# Patient Record
Sex: Female | Born: 1950 | Race: White | Hispanic: No | Marital: Married | Smoking: Former smoker
Health system: Southern US, Community
[De-identification: ages and names within clinical notes are randomized; demographics above are authoritative.]

## PROBLEM LIST (undated history)

## (undated) DIAGNOSIS — K219 Gastro-esophageal reflux disease without esophagitis: Secondary | ICD-10-CM

## (undated) DIAGNOSIS — M4800 Spinal stenosis, site unspecified: Secondary | ICD-10-CM

## (undated) DIAGNOSIS — I350 Nonrheumatic aortic (valve) stenosis: Secondary | ICD-10-CM

## (undated) DIAGNOSIS — F0390 Unspecified dementia without behavioral disturbance: Secondary | ICD-10-CM

## (undated) DIAGNOSIS — G709 Myoneural disorder, unspecified: Secondary | ICD-10-CM

## (undated) DIAGNOSIS — Z9289 Personal history of other medical treatment: Secondary | ICD-10-CM

## (undated) DIAGNOSIS — M5136 Other intervertebral disc degeneration, lumbar region: Secondary | ICD-10-CM

## (undated) DIAGNOSIS — R011 Cardiac murmur, unspecified: Secondary | ICD-10-CM

## (undated) DIAGNOSIS — Z01810 Encounter for preprocedural cardiovascular examination: Secondary | ICD-10-CM

## (undated) DIAGNOSIS — R413 Other amnesia: Secondary | ICD-10-CM

## (undated) DIAGNOSIS — R0602 Shortness of breath: Secondary | ICD-10-CM

## (undated) DIAGNOSIS — E785 Hyperlipidemia, unspecified: Secondary | ICD-10-CM

## (undated) DIAGNOSIS — M25473 Effusion, unspecified ankle: Secondary | ICD-10-CM

## (undated) DIAGNOSIS — K5900 Constipation, unspecified: Secondary | ICD-10-CM

## (undated) DIAGNOSIS — J45909 Unspecified asthma, uncomplicated: Secondary | ICD-10-CM

## (undated) HISTORY — DX: Other amnesia: R41.3

## (undated) HISTORY — DX: Other intervertebral disc degeneration, lumbar region: M51.36

## (undated) HISTORY — DX: Spinal stenosis, site unspecified: M48.00

## (undated) HISTORY — DX: Nonrheumatic aortic (valve) stenosis: I35.0

## (undated) HISTORY — DX: Unspecified asthma, uncomplicated: J45.909

## (undated) HISTORY — DX: Cardiac murmur, unspecified: R01.1

## (undated) HISTORY — DX: Hyperlipidemia, unspecified: E78.5

---

## 2008-06-15 ENCOUNTER — Other Ambulatory Visit: Admission: RE | Admit: 2008-06-15 | Discharge: 2008-06-15 | Payer: Self-pay | Admitting: Family Medicine

## 2012-04-06 ENCOUNTER — Other Ambulatory Visit: Payer: Self-pay | Admitting: *Deleted

## 2012-04-06 ENCOUNTER — Other Ambulatory Visit: Payer: Self-pay | Admitting: Family Medicine

## 2012-04-06 DIAGNOSIS — R413 Other amnesia: Secondary | ICD-10-CM

## 2012-04-09 ENCOUNTER — Ambulatory Visit
Admission: RE | Admit: 2012-04-09 | Discharge: 2012-04-09 | Disposition: A | Discharge status: Home / Self Care | Payer: Federal, State, Local not specified - PPO | Source: Ambulatory Visit | Attending: Family Medicine | Admitting: Family Medicine

## 2012-04-09 DIAGNOSIS — R413 Other amnesia: Secondary | ICD-10-CM

## 2012-05-18 DIAGNOSIS — R41840 Attention and concentration deficit: Secondary | ICD-10-CM

## 2012-05-25 DIAGNOSIS — R41840 Attention and concentration deficit: Secondary | ICD-10-CM

## 2012-06-23 ENCOUNTER — Other Ambulatory Visit: Payer: Self-pay | Admitting: Neurosurgery

## 2012-06-23 DIAGNOSIS — M542 Cervicalgia: Secondary | ICD-10-CM

## 2012-06-23 DIAGNOSIS — M5030 Other cervical disc degeneration, unspecified cervical region: Secondary | ICD-10-CM

## 2012-06-29 ENCOUNTER — Ambulatory Visit
Admission: RE | Admit: 2012-06-29 | Discharge: 2012-06-29 | Disposition: A | Discharge status: Home / Self Care | Payer: Federal, State, Local not specified - PPO | Source: Ambulatory Visit | Attending: Neurosurgery | Admitting: Neurosurgery

## 2012-06-29 DIAGNOSIS — M5030 Other cervical disc degeneration, unspecified cervical region: Secondary | ICD-10-CM

## 2012-06-29 DIAGNOSIS — M542 Cervicalgia: Secondary | ICD-10-CM

## 2012-07-06 ENCOUNTER — Encounter: Payer: Self-pay | Admitting: Internal Medicine

## 2012-07-06 ENCOUNTER — Ambulatory Visit (HOSPITAL_COMMUNITY)
Admission: RE | Admit: 2012-07-06 | Discharge: 2012-07-06 | Disposition: A | Discharge status: Home / Self Care | Payer: Federal, State, Local not specified - PPO | Source: Ambulatory Visit | Attending: Internal Medicine | Admitting: Internal Medicine

## 2012-07-06 ENCOUNTER — Ambulatory Visit (INDEPENDENT_AMBULATORY_CARE_PROVIDER_SITE_OTHER): Payer: Federal, State, Local not specified - PPO | Admitting: Internal Medicine

## 2012-07-06 VITALS — BP 124/74 | HR 72 | Temp 97.6°F | Ht <= 58 in | Wt 216.6 lb

## 2012-07-06 DIAGNOSIS — R0602 Shortness of breath: Secondary | ICD-10-CM | POA: Insufficient documentation

## 2012-07-06 DIAGNOSIS — R0600 Dyspnea, unspecified: Secondary | ICD-10-CM

## 2012-07-06 DIAGNOSIS — R0989 Other specified symptoms and signs involving the circulatory and respiratory systems: Secondary | ICD-10-CM | POA: Insufficient documentation

## 2012-07-06 DIAGNOSIS — R0609 Other forms of dyspnea: Secondary | ICD-10-CM

## 2012-07-06 LAB — PULMONARY FUNCTION TEST

## 2012-07-06 MED ORDER — ALBUTEROL SULFATE (5 MG/ML) 0.5% IN NEBU
2.5000 mg | INHALATION_SOLUTION | Freq: Once | RESPIRATORY_TRACT | Status: AC
  Administered 2012-07-06: 2.5 mg via RESPIRATORY_TRACT

## 2012-07-06 NOTE — Assessment & Plan Note (Signed)
-   07/06/2012  Walked RA x 3 laps @ 185 ft each stopped due to  End of study, no desat - Fluoroscopy diaphragm  07/06/2012 > sym and nl fx bilaterally - PFTs ordered  She does not have significant copd clinically but would like to get a baseline set of pft's and note her diaphragms work fine but the phrenic nerves are at risk - given her body habitus she'll need both to lie flat flat comfortably and may benefit from post op bipap transiently but I see no pulmonary contraindicatoin to surgery.    Obesity also risks atx/ dvt/pe so strongly admonished min narcotics and maximal mobilization post op  In addition, max gerd rx perioperatively to reduce upper airway irritation from suspected gerd.  See instructions for specific recommendations which were reviewed directly with the patient who was given a copy with highlighter outlining the key components.

## 2012-07-06 NOTE — Progress Notes (Signed)
  Subjective:    Patient ID: Anna Lawson, female    DOB: 1951-01-27  MRN: [id number]  HPI  [facility]   64 yowf quit smoking around 2003 with mild doe seemed some better and no change on spriva referred 07/06/2012  by Dr Wynetta Emery for preop eval for possible cervical fusion.  07/06/2012 1st pulmonary eval cc worse breathing x 6 months not able to do go a full flight s stopping assoc with tingling hands and reports concern her diaphragm nerves may be at risk.  She is uncomfortable also lying flat with some sensation of chest and throat but No obvious daytime variabilty or assoc chronic cough or cp or chest tightness, subjective wheeze overt sinus or hb symptoms. No unusual exp hx or h/o childhood pna/ asthma or premature birth to her knowledge.  Also denies any obvious fluctuation of symptoms with weather or environmental changes or other aggravating or alleviating factors except as outlined above         Review of Systems  Constitutional: Negative for fever, chills and unexpected weight change.  HENT: Positive for dental problem. Negative for ear pain, nosebleeds, congestion, sore throat, rhinorrhea, sneezing, trouble swallowing, voice change, postnasal drip and sinus pressure.   Eyes: Negative for visual disturbance.  Respiratory: Positive for shortness of breath. Negative for cough and choking.   Cardiovascular: Positive for chest pain and leg swelling.  Gastrointestinal: Negative for vomiting, abdominal pain and diarrhea.  Genitourinary: Negative for difficulty urinating.  Musculoskeletal: Positive for arthralgias.  Skin: Negative for rash.  Neurological: Negative for tremors, syncope and headaches.  Hematological: Does not bruise/bleed easily.       Objective:   Physical Exam Anxious obese wf nad Wt Readings from Last 3 Encounters:  07/06/12 216 lb 9.6 oz (98.249 kg)   HEENT: nl dentition, turbinates, and orophanx. Nl external ear canals without cough reflex   NECK :  without  JVD/Nodes/TM/ nl carotid upstrokes bilaterally   LUNGS: no acc muscle use, clear to A and P bilaterally without cough on insp or exp maneuvers   CV:  RRR  no s3 or murmur or increase in P2, no edema   ABD:  Obese soft and nontender with nl excursion in the supine position. No bruits or organomegaly, bowel sounds nl  MS:  warm without deformities, calf tenderness, cyanosis or clubbing  SKIN: warm and dry without lesions    NEURO:  alert, approp, no deficits    Fluoroscopy/ sniff test 07/06/2012 : Nl diaphragm fxn       Assessment & Plan:

## 2012-07-06 NOTE — Progress Notes (Signed)
Quick Note:  Spoke with pt and notified of results per Dr. Wert. Pt verbalized understanding and denied any questions.  ______ 

## 2012-07-06 NOTE — Patient Instructions (Addendum)
Please see patient coordinator before you leave today  to schedule fluorosocopy of diaphragms  asap along with pft's to measure Max Insp Force   GERD (REFLUX)  is an extremely common cause of respiratory symptoms, many times with no significant heartburn at all.    It can be treated with medication, but also with lifestyle changes including avoidance of late meals, excessive alcohol, smoking cessation, and avoid fatty foods, chocolate, peppermint, colas, red wine, and acidic juices such as orange juice.  NO MINT OR MENTHOL PRODUCTS SO NO COUGH DROPS  USE SUGARLESS CANDY INSTEAD (jolley ranchers or Stover's)  NO OIL BASED VITAMINS - use powdered substitutes - No fish oil    Prilosec 20 mg Take 30- 60 min before your first and last meals of the day until surgery    The most important aspect of your care is mobilization post op.

## 2012-07-08 ENCOUNTER — Telehealth: Payer: Self-pay | Admitting: Internal Medicine

## 2012-07-08 NOTE — Telephone Encounter (Signed)
Per Dr. Sherene Sires- PFT shows minimal airflow obstruction. Ok to proceed with surgery.  ATC the pt with results, NA and so LMTCB.

## 2012-07-08 NOTE — Telephone Encounter (Signed)
Results and note were both faxed to Dr. Lonie Peak office at [phone]

## 2012-07-08 NOTE — Telephone Encounter (Signed)
Patient returned call.  Advised of PFT results as stated by MW below.  Pt verbalized her understanding and requests these results and phone note be faxed to Dr Lonie Peak office.  As Verlon Au has the interpreted results, will route message to her inbasket per her request.

## 2012-07-15 ENCOUNTER — Other Ambulatory Visit: Payer: Self-pay | Admitting: Neurosurgery

## 2012-07-16 ENCOUNTER — Encounter (HOSPITAL_COMMUNITY): Payer: Self-pay | Admitting: Vascular Surgery

## 2012-08-03 ENCOUNTER — Encounter (HOSPITAL_COMMUNITY): Payer: Self-pay | Admitting: Pharmacy Technician

## 2012-08-06 ENCOUNTER — Encounter (HOSPITAL_COMMUNITY): Payer: Self-pay | Admitting: Vascular Surgery

## 2012-08-06 NOTE — Consult Note (Addendum)
Anesthesia chart review: Patient is a 62 year old female posted for 4 level posterior cervical fusion/foraminotomy by Dr. Wynetta Emery on 08/12/12.  History includes morbid obesity, former smoker, spinal stenosis, hyperlipidemia, heart murmur with echo from 08/2011 showing bicuspid AV with mild to moderate AR and moderate AS.  PCP is Dr. Laurann Montana.  She has also been seen Dr. Vickey Huger at [facility]) recently for possible dementia and sleep disturbance work-up .  Cardiologist is Dr. Armanda Magic [facility]).  She was last seen on 09/05/11 for follow-up of AS and "constant chest tightness" with exertion (when she become SOB) with history of normal coronaries by cath in 2006.  Dr. Mayford Knife recommended a repeat echo (see below) that showed some progression of her AS from mild to moderate with one year follow-up with echo recommended.  Following the echo, she recommended a stress test (see below) that was normal.  Event monitor read on 10/21/11 showed NSR, SB during sleep as low as 46 bpm, ST as high as 136 bpm, occasional PACs and PVCs which were felt benign.    EKG on 09/05/11 showed SB @ 58 bpm, poor r wave progression, negative T waves in septal leads.  Nuclear stress test on 09/27/11 [facility]) showed normal myocardial perfusion, no regional wall motion abnormalities. There is stress EF 83%. Exercise capacity 5.0 METS.  Echo [facility]) from 09/11/11 showed left ventricular ejection fraction estimated at 57.9%, trace mitral valve regurgitation, aortic valve appears to be bicuspid, aortic valve is sclerotic with reduced excursion, mild to moderate aortic valve regurgitation, moderate aortic valve stenosis with AVA (vel) 0.93 cm2 and AVA (vti) 0.97 cm2, mild aortic root dilation, trivial posterior pericardial effusion, trivial tricuspid regurgitation, trace pulmonic valve regurgitation, pulmonary vein Doppler and tissue Doppler suggests normal diastolic function without elevated left  atrial pressure.  She had a cardiac cath on 03/26/05 at [facility] that showed angiographically normal coronary arteries, normal left ventricular function, mild aortic stenosis, mild aortic insufficiency, no mitral regurgitation or mitral stenosis, normal PA pressures and hemodynamics. She was followed by Cardiologist Dr. Sherre Scarlet at that time for AS/AR with bacterial endocarditis prophylaxis recommended.  Sleep study done in 05/2012 ([facility]) showed hypoxemia with possible cardiac arrhythmia ("extremely variable heart rate"), but not sleep apnea.    Dr. Wynetta Emery sent patient to Dr. Sherene Sires for a preoperative pulmonology evaluation. Please refer to his note from 07/06/2012. His assessment included: "- 07/06/2012 Walked RA x 3 laps @ 185 ft each stopped due to End of study, no desat  - Fluoroscopy diaphragm 07/06/2012 > sym and nl fx bilaterally  - PFTs ordered  She does not have significant copd clinically but would like to get a baseline set of pft's and note her diaphragms work fine but the phrenic nerves are at risk - given her body habitus she'll need both to lie flat flat comfortably and may benefit from post op bipap transiently but I see no pulmonary contraindicatoin to surgery.  Obesity also risks atx/ dvt/pe so strongly admonished min narcotics and maximal mobilization post op  In addition, max gerd rx perioperatively to reduce upper airway irritation from suspected gerd.  See instructions for specific recommendations which were reviewed directly with the patient who was given a copy with highlighter outlining the key components." See PFT results with faxed records from [facility] from [facility].  These were reviewed by Dr. Sherene Sires, as "PFT shows minimal airflow obstruction. Ok to proceed with surgery."   CXR on 08/07/12 showed cardiomegaly.  No active lung disease. Slight hyperaeration.  Preoperative labs noted.  I spoke with patient during her PAT visit.  She has no new CV  symptoms.  No syncope.  Exam shows heart RRR, II/VI SEM, lungs clear, no carotid bruits.  Legs large, but no pitting edema. She feels at her baseline from a cardiopulmonary standpoint.  I reviewed above with Anesthesiologist Dr. Jean Rosenthal who agrees that patient should be okay to proceed if no significant change in her status.  Shonna Chock, PA-C 08/07/12 1200

## 2012-08-07 ENCOUNTER — Encounter (HOSPITAL_COMMUNITY)
Admission: RE | Admit: 2012-08-07 | Discharge: 2012-08-07 | Disposition: A | Discharge status: Home / Self Care | Payer: Federal, State, Local not specified - PPO | Source: Ambulatory Visit | Attending: Anesthesiology | Admitting: Anesthesiology

## 2012-08-07 ENCOUNTER — Encounter (HOSPITAL_COMMUNITY): Payer: Self-pay | Admitting: Vascular Surgery

## 2012-08-07 ENCOUNTER — Encounter (HOSPITAL_COMMUNITY)
Admission: RE | Admit: 2012-08-07 | Discharge: 2012-08-07 | Disposition: A | Discharge status: Home / Self Care | Payer: Federal, State, Local not specified - PPO | Source: Ambulatory Visit | Attending: Neurosurgery | Admitting: Neurosurgery

## 2012-08-07 ENCOUNTER — Encounter (HOSPITAL_COMMUNITY): Payer: Self-pay

## 2012-08-07 HISTORY — DX: Shortness of breath: R06.02

## 2012-08-07 HISTORY — DX: Unspecified dementia, unspecified severity, without behavioral disturbance, psychotic disturbance, mood disturbance, and anxiety: F03.90

## 2012-08-07 HISTORY — DX: Personal history of other medical treatment: Z92.89

## 2012-08-07 HISTORY — DX: Effusion, unspecified ankle: M25.473

## 2012-08-07 HISTORY — DX: Constipation, unspecified: K59.00

## 2012-08-07 LAB — CBC
HCT: 44.4 % (ref 36.0–46.0)
Hemoglobin: 14.8 g/dL (ref 12.0–15.0)
RBC: 4.97 MIL/uL (ref 3.87–5.11)

## 2012-08-07 LAB — SURGICAL PCR SCREEN: MRSA, PCR: NEGATIVE

## 2012-08-07 LAB — BASIC METABOLIC PANEL
Chloride: 100 mEq/L (ref 96–112)
GFR calc Af Amer: >90 mL/min (ref >90)
GFR calc non Af Amer: >90 mL/min (ref >90)
Potassium: 4 mEq/L (ref 3.5–5.1)
Sodium: 140 mEq/L (ref 135–145)

## 2012-08-07 NOTE — Pre-Procedure Instructions (Signed)
Anna Lawson  08/07/2012   Your procedure is scheduled on:  Wednesday, January 15th.  Report to [facility] at 9:30AM.  Call this number if you have problems the morning of surgery: [phone]   Remember:   Do not eat food or drink liquids after midnight.   Take these medicines the morning of surgery with A SIP OF WATER: Omeprazole (Prilosec), Donepezil (Aricept).  May take Tramadol (Ultram) if needed.   Do not wear jewelry, make-up or nail polish.  Do not wear lotions, powders, or perfumes. You may wear deodorant.  Do not shave 48 hours prior to surgery. Men may shave face and neck.  Do not bring valuables to the hospital.  Contacts, dentures or bridgework may not be worn into surgery.  Leave suitcase in the car. After surgery it may be brought to your room.  For patients admitted to the hospital, checkout time is 11:00 AM the day of  discharge.   Patients discharged the day of surgery will not be allowed to drive  home.  Name and phone number of your driver: NA    Special Instructions: Shower using CHG 2 nights before surgery and the night before surgery.  If you shower the day of surgery use CHG.  Use special wash - you have one bottle of CHG for all showers.  You should use approximately 1/3 of the bottle for each shower.   Please read over the following fact sheets that you were given: Pain Booklet, Coughing and Deep Breathing and Surgical Site Infection Prevention

## 2012-08-11 MED ORDER — CEFAZOLIN SODIUM-DEXTROSE 2-3 GM-% IV SOLR
2.0000 g | INTRAVENOUS | Status: DC
  Filled 2012-08-11 (×2): qty 50

## 2012-08-12 ENCOUNTER — Encounter (HOSPITAL_COMMUNITY): Payer: Self-pay | Admitting: Vascular Surgery

## 2012-08-12 ENCOUNTER — Encounter (HOSPITAL_COMMUNITY): Payer: Self-pay | Admitting: Interventional Cardiology

## 2012-08-12 ENCOUNTER — Encounter (HOSPITAL_COMMUNITY): Payer: Self-pay | Admitting: *Deleted

## 2012-08-12 ENCOUNTER — Ambulatory Visit (HOSPITAL_COMMUNITY)
Admission: RE | Admit: 2012-08-12 | Discharge: 2012-08-12 | Disposition: A | Discharge status: Home / Self Care | Payer: Federal, State, Local not specified - PPO | Source: Ambulatory Visit | Attending: Neurosurgery | Admitting: Neurosurgery

## 2012-08-12 ENCOUNTER — Encounter (HOSPITAL_COMMUNITY)
Admission: RE | Disposition: A | Discharge status: Home / Self Care | Payer: Self-pay | Source: Ambulatory Visit | Attending: Neurosurgery

## 2012-08-12 DIAGNOSIS — M51379 Other intervertebral disc degeneration, lumbosacral region without mention of lumbar back pain or lower extremity pain: Secondary | ICD-10-CM | POA: Insufficient documentation

## 2012-08-12 DIAGNOSIS — I359 Nonrheumatic aortic valve disorder, unspecified: Secondary | ICD-10-CM | POA: Insufficient documentation

## 2012-08-12 DIAGNOSIS — M5137 Other intervertebral disc degeneration, lumbosacral region: Secondary | ICD-10-CM | POA: Insufficient documentation

## 2012-08-12 DIAGNOSIS — Z5309 Procedure and treatment not carried out because of other contraindication: Secondary | ICD-10-CM | POA: Insufficient documentation

## 2012-08-12 DIAGNOSIS — Z01818 Encounter for other preprocedural examination: Secondary | ICD-10-CM | POA: Insufficient documentation

## 2012-08-12 DIAGNOSIS — Z01812 Encounter for preprocedural laboratory examination: Secondary | ICD-10-CM | POA: Insufficient documentation

## 2012-08-12 DIAGNOSIS — Z01810 Encounter for preprocedural cardiovascular examination: Secondary | ICD-10-CM | POA: Insufficient documentation

## 2012-08-12 DIAGNOSIS — I350 Nonrheumatic aortic (valve) stenosis: Secondary | ICD-10-CM | POA: Insufficient documentation

## 2012-08-12 DIAGNOSIS — R0600 Dyspnea, unspecified: Secondary | ICD-10-CM

## 2012-08-12 HISTORY — DX: Encounter for preprocedural cardiovascular examination: Z01.810

## 2012-08-12 SURGERY — CANCELLED PROCEDURE

## 2012-08-12 MED ORDER — LACTATED RINGERS IV SOLN
INTRAVENOUS | Status: DC | PRN
  Administered 2012-08-12: 11:00:00 via INTRAVENOUS

## 2012-08-12 MED ORDER — SODIUM CHLORIDE 0.9 % IV SOLN
INTRAVENOUS | Status: AC
  Filled 2012-08-12: qty 500

## 2012-08-12 MED ORDER — BACITRACIN 50000 UNITS IM SOLR
INTRAMUSCULAR | Status: AC
  Filled 2012-08-12: qty 1

## 2012-08-12 SURGICAL SUPPLY — 43 items
BAG DECANTER FOR FLEXI CONT (MISCELLANEOUS) ×2 IMPLANT
BENZOIN TINCTURE PRP APPL 2/3 (GAUZE/BANDAGES/DRESSINGS) ×4 IMPLANT
BLADE SURG 11 STRL SS (BLADE) ×2 IMPLANT
BLADE SURG ROTATE 9660 (MISCELLANEOUS) ×2 IMPLANT
CANISTER SUCTION 2500CC (MISCELLANEOUS) ×2 IMPLANT
CLOTH BEACON ORANGE TIMEOUT ST (SAFETY) ×2 IMPLANT
CONT SPEC 4OZ CLIKSEAL STRL BL (MISCELLANEOUS) ×2 IMPLANT
DECANTER SPIKE VIAL GLASS SM (MISCELLANEOUS) ×2 IMPLANT
DRAPE C-ARM 42X72 X-RAY (DRAPES) IMPLANT
DRAPE LAPAROTOMY 100X72 PEDS (DRAPES) ×2 IMPLANT
DRAPE MICROSCOPE ZEISS OPMI (DRAPES) IMPLANT
DRAPE POUCH INSTRU U-SHP 10X18 (DRAPES) ×2 IMPLANT
DRAPE SURG 17X23 STRL (DRAPES) ×8 IMPLANT
DRSG OPSITE 4X5.5 SM (GAUZE/BANDAGES/DRESSINGS) ×4 IMPLANT
ELECT REM PT RETURN 9FT ADLT (ELECTROSURGICAL) ×2
ELECTRODE REM PT RTRN 9FT ADLT (ELECTROSURGICAL) ×1 IMPLANT
GAUZE SPONGE 4X4 16PLY XRAY LF (GAUZE/BANDAGES/DRESSINGS) IMPLANT
GLOVE BIO SURGEON STRL SZ8 (GLOVE) ×2 IMPLANT
GLOVE EXAM NITRILE LRG STRL (GLOVE) IMPLANT
GLOVE EXAM NITRILE MD LF STRL (GLOVE) IMPLANT
GLOVE EXAM NITRILE XL STR (GLOVE) IMPLANT
GLOVE EXAM NITRILE XS STR PU (GLOVE) IMPLANT
GLOVE INDICATOR 8.5 STRL (GLOVE) ×2 IMPLANT
GOWN BRE IMP SLV AUR LG STRL (GOWN DISPOSABLE) IMPLANT
GOWN BRE IMP SLV AUR XL STRL (GOWN DISPOSABLE) ×2 IMPLANT
GOWN STRL REIN 2XL LVL4 (GOWN DISPOSABLE) IMPLANT
KIT BASIN OR (CUSTOM PROCEDURE TRAY) ×2 IMPLANT
KIT ROOM TURNOVER OR (KITS) ×2 IMPLANT
MARKER SKIN DUAL TIP RULER LAB (MISCELLANEOUS) ×2 IMPLANT
NEEDLE HYPO 25X1 1.5 SAFETY (NEEDLE) ×2 IMPLANT
NEEDLE SPNL 20GX3.5 QUINCKE YW (NEEDLE) ×2 IMPLANT
NS IRRIG 1000ML POUR BTL (IV SOLUTION) ×2 IMPLANT
PAD ARMBOARD 7.5X6 YLW CONV (MISCELLANEOUS) ×6 IMPLANT
RUBBERBAND STERILE (MISCELLANEOUS) IMPLANT
SPONGE GAUZE 4X4 12PLY (GAUZE/BANDAGES/DRESSINGS) ×2 IMPLANT
SPONGE LAP 4X18 X RAY DECT (DISPOSABLE) IMPLANT
SPONGE SURGIFOAM ABS GEL 100 (HEMOSTASIS) ×2 IMPLANT
STRIP CLOSURE SKIN 1/2X4 (GAUZE/BANDAGES/DRESSINGS) ×2 IMPLANT
SYR 20ML ECCENTRIC (SYRINGE) ×2 IMPLANT
TOWEL OR 17X24 6PK STRL BLUE (TOWEL DISPOSABLE) ×2 IMPLANT
TOWEL OR 17X26 10 PK STRL BLUE (TOWEL DISPOSABLE) ×2 IMPLANT
TRAY FOLEY CATH 14FRSI W/METER (CATHETERS) IMPLANT
WATER STERILE IRR 1000ML POUR (IV SOLUTION) ×2 IMPLANT

## 2012-08-12 NOTE — Preoperative (Signed)
Beta Blockers   Reason not to administer Beta Blockers:Not Applicable 

## 2012-08-12 NOTE — Progress Notes (Signed)
After review of notes and cardiac history and after exam of patient, feel her aortic stenosis needs further eval prior to her surgery. Spoke c Dr Armanda Magic and Sr Eldridge Dace who also agree a followup ECHO should be performed prior to cervical surgery. That will be performed on an outpatient basis.  She will be canceled for today.

## 2012-08-12 NOTE — Anesthesia Preprocedure Evaluation (Addendum)
Anesthesia Evaluation  Patient identified by MRN, date of birth, ID band Patient awake    Reviewed: Allergy & Precautions, H&P , NPO status , Patient's Chart, lab work & pertinent test results  Airway Mallampati: II  Neck ROM: Full    Dental  (+) Edentulous Upper and Loose,    Pulmonary shortness of breath and with exertion, former smoker,  CHEST - 2 VIEW   Comparison: None.   Findings: No active infiltrate or effusion is seen.  The lungs are slightly hyperaerated.  There is cardiomegaly present.  There are degenerative changes throughout the thoracic spine, with a mild thoracic curvature convex to the right.   IMPRESSION: Cardiomegaly.  No active lung disease.  Slight hyperaeration.     Original Report Authenticated By: Dwyane Dee, M.D.  08-07-12 breath sounds clear to auscultation        Cardiovascular Exercise Tolerance: Poor + angina with exertion + DOE + Valvular Problems/Murmurs AS Rhythm:Regular Rate:Normal + Systolic murmurs Loud systolic murmur   Neuro/Psych CT CERVICAL SPINE WITHOUT CONTRAST 06-29-12 IMPRESSION: Severe multilevel degenerative disc disease as described from C3- T1.   Asymmetric uncinate spurring and facet arthropathy is widespread and predominately left-sided.   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  See comments above.         GI/Hepatic Neg liver ROS, GERD-  Medicated and Controlled,  Endo/Other  Morbid obesity  Renal/GU negative Renal ROS     Musculoskeletal  (+) Arthritis -, Osteoarthritis,    Abdominal (+) + obese,   Peds  Hematology negative hematology ROS (+)   Anesthesia Other Findings Loose lower teeth noted  Reproductive/Obstetrics                      Anesthesia Physical Anesthesia Plan  ASA:  III  Anesthesia Plan: General   Post-op Pain Management:    Induction: Intravenous  Airway Management Planned: Oral ETT  Additional Equipment: Arterial line  Intra-op Plan:   Post-operative Plan: Extubation in OR  Informed Consent: I have reviewed the patients History and Physical, chart, labs and discussed the procedure including the risks, benefits and alternatives for the proposed anesthesia with the patient or authorized representative who has indicated his/her understanding and acceptance.   Dental advisory given  Plan Discussed with: CRNA and Surgeon  Anesthesia Plan Comments:         Anesthesia Quick Evaluation

## 2012-08-12 NOTE — H&P (Addendum)
Anna Lawson is an 62 y.o. female.   Chief Complaint: Neck and bilateral arm and tingling   HPI: Patient is a very pleasant 62 year old female who has had progressive worsening neck pain and weakness she is also interested shocking feels that her arms or hands and her she coughs or sneezes she reports dizziness and lightheadedness whenever she looks up she has been noticing progressive weakness in her hands difficulty opening jars and buttoning her clothes. Workup initially and MRI scan showing severe cervical stenosis she subsequently underwent CAT scan of her neck which showed severe OPLL so due to her progression of clinical syndrome and imaging findings and takes her treatment have recommended posterior cervical decompression fusion from C3-C7 next is reviewed the risks benefits of the operation as well as perioperative course and expectations of outcome of her surgery she understands and agrees to proceed forward.   Past Medical History  Diagnosis Date  . Spinal stenosis   . Hyperlipidemia   . Heart murmur   . Aortic stenosis     bicuspid AV, mid to mod AR, mod AS by 09/17/11 echo [facility])  . Shortness of breath     all time  . Ankle edema     takes HCTZ  . Dementia     a little  . Constipation   . History of blood transfusion     Past Surgical History  Procedure Date  . Cardiac catheterization     03/26/05 [facility]): normal coronaries, normal LV function, mild AS/AI.  No MR/MS.  Normal PA pressures and hemodynamics.    Family History  Problem Relation Age of Onset  . Emphysema Maternal Grandmother     never smoked  . Emphysema Paternal Uncle     never smoked  . Heart disease Mother   . Bladder Cancer Father    Social History:  reports that she quit smoking about 11 years ago. Her smoking use included Cigarettes. She has a 35 pack-year smoking history. She has never used smokeless tobacco. She reports that she does not drink alcohol or use illicit  drugs.  Allergies:  Allergies  Allergen Reactions  . Adhesive (Tape)     blisters  . Codeine Nausea And Vomiting    Medications Prior to Admission  Medication Sig Dispense Refill  . aspirin 81 MG tablet Take 81 mg by mouth daily.      Marland Kitchen donepezil (ARICEPT) 5 MG tablet Take 5 mg by mouth daily.       . hydrochlorothiazide (HYDRODIURIL) 25 MG tablet Take 25 mg by mouth daily.      Marland Kitchen omeprazole (PRILOSEC OTC) 20 MG tablet Take 20 mg by mouth 2 (two) times daily.       . polyethylene glycol (MIRALAX / GLYCOLAX) packet Take 17 g by mouth daily as needed. For constipation      . traMADol (ULTRAM) 50 MG tablet Take 50 mg by mouth every 6 (six) hours as needed. For pain        No results found for this or any previous visit (from the past 48 hour(s)). No results found.  Review of Systems  Constitutional: Negative.   HENT: Positive for neck pain.   Eyes: Negative.   Respiratory: Positive for shortness of breath and wheezing.   Cardiovascular: Positive for chest pain.  Gastrointestinal: Positive for heartburn.  Genitourinary: Negative.   Musculoskeletal: Positive for myalgias.  Skin: Negative.   Neurological: Positive for tingling, tremors, sensory change and focal weakness.  Blood pressure 153/89, pulse 84, temperature 97.9 F (36.6 C), temperature source Oral, resp. rate 18, SpO2 93.00%. Physical Exam  Constitutional: She is oriented to person, place, and time. She appears well-developed and well-nourished.  HENT:  Head: Normocephalic.  Eyes: Pupils are equal, round, and reactive to light.  Neck: Normal range of motion.  Cardiovascular: Normal rate.   Respiratory: Effort normal.  GI: Soft.  Neurological: She is alert and oriented to person, place, and time. She has normal strength. GCS eye subscore is 4. GCS verbal subscore is 5. GCS motor subscore is 6.  Reflex Scores:      Brachioradialis reflexes are 1+ on the right side and 1+ on the left side.      Patellar reflexes  are 1+ on the right side and 1+ on the left side.      Achilles reflexes are 1+ on the right side and 1+ on the left side.      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

## 2012-08-12 NOTE — Progress Notes (Signed)
Pt. Arrived to short stay, orders not signed, left message with Herbert Seta in neuro OR.

## 2012-08-12 NOTE — Consult Note (Signed)
Admit date: 08/12/2012 Referring Physician  Dr. Jacklynn Bue Primary Physician  Dr. Esmond Harps Primary Cardiologist  Dr. Mayford Knife Reason for Consultation  Preoperative evaluation/aortic stenosis  HPI: 62 y/o who needs a four level cervical fusion surgery, which will be at least 3 hours long.  She has a bicuspid aortic valve and moderate aortic stenosis, and mild to moderate aortic regurgitation.  She was due for a repeat echo in 2/14.  When she was being interviewed by the anesthesiologist, she mentions some shortness of breath.  She reports that she has had some dyspnea on exertion.  It got worse in June of 2013.  When she goes upstairs she feels some shortness of breath.  Along with the shortness of breath, there can be some chest tightness of note, she had a stress test in 2013 showing no ischemia.  She also had an echocardiogram in February 2013 with the above findings.  She is having heart catheterization several years ago showing no significant CAD.  Currently at rest, she has no shortness of breath.  She has been somewhat deconditioned as well due to her neurosurgical issues.  Her main symptom from the cervical spine disease is numbness and tingling in her hands.     PMH:   Past Medical History  Diagnosis Date  . Spinal stenosis   . Hyperlipidemia   . Heart murmur   . Aortic stenosis     bicuspid AV, mid to mod AR, mod AS by 09/17/11 echo [facility])  . Shortness of breath     all time  . Ankle edema     takes HCTZ  . Dementia     a little  . Constipation   . History of blood transfusion      PSH:   Past Surgical History  Procedure Date  . Cardiac catheterization     03/26/05 [facility]): normal coronaries, normal LV function, mild AS/AI.  No MR/MS.  Normal PA pressures and hemodynamics.    Allergies:  Adhesive and Codeine Prior to Admit Meds:   Prescriptions prior to admission  Medication Sig Dispense Refill  . aspirin 81 MG tablet Take 81 mg by mouth daily.      Marland Kitchen  donepezil (ARICEPT) 5 MG tablet Take 5 mg by mouth daily.       . hydrochlorothiazide (HYDRODIURIL) 25 MG tablet Take 25 mg by mouth daily.      Marland Kitchen omeprazole (PRILOSEC OTC) 20 MG tablet Take 20 mg by mouth 2 (two) times daily.       . polyethylene glycol (MIRALAX / GLYCOLAX) packet Take 17 g by mouth daily as needed. For constipation      . traMADol (ULTRAM) 50 MG tablet Take 50 mg by mouth every 6 (six) hours as needed. For pain       Fam HX:    Family History  Problem Relation Age of Onset  . Emphysema Maternal Grandmother     never smoked  . Emphysema Paternal Uncle     never smoked  . Heart disease Mother   . Bladder Cancer Father    Social HX:    History   Social History  . Marital Status: Married    Spouse Name: N/A    Number of Children: 3  . Years of Education: N/A   Occupational History  . LPN    Social History Main Topics  . Smoking status: Former Smoker -- 1.0 packs/day for 35 years    Types: Cigarettes    Quit  date: 07/29/2001  . Smokeless tobacco: Never Used  . Alcohol Use: No  . Drug Use: No  . Sexually Active: Not on file   Other Topics Concern  . Not on file   Social History Narrative  . No narrative on file     ROS:  All 11 ROS were addressed and are negative except what is stated in the HPI  Physical Exam: Blood pressure 153/89, pulse 84, temperature 97.9 F (36.6 C), temperature source Oral, resp. rate 18, SpO2 93.00%.    General: Well developed, well nourished, in no acute distress Head: Eyes PERRLA, No xanthomas.   Normal cephalic and atramatic  Lungs: Clear bilaterally to auscultation and percussion. Heart:  HRRR S1 S2, 2/6 systolic murmur Abdomen: Nondistended Msk: Normal strength and tone for age. Extremities:   No  edema.   Neuro: Moving all extremities Psych:  Normal affect, responds appropriately    Labs:   Lab Results  Component Value Date   WBC 8.0 08/07/2012   HGB 14.8 08/07/2012   HCT 44.4 08/07/2012   MCV 89.3 08/07/2012    PLT 252 08/07/2012    Lab 08/07/12 0941  NA 140  K 4.0  CL 100  CO2 29  BUN 11  CREATININE 0.70  CALCIUM 9.4  PROT --  BILITOT --  ALKPHOS --  ALT --  AST --  GLUCOSE 101*   No results found for this basename: PTT   No results found for this basename: INR, PROTIME   No results found for this basename: CKTOTAL, CKMB, CKMBINDEX, TROPONINI     No results found for this basename: CHOL   No results found for this basename: HDL   No results found for this basename: LDLCALC   No results found for this basename: TRIG   No results found for this basename: CHOLHDL   No results found for this basename: LDLDIRECT      Radiology:  No results found.    ASSESSMENT: Moderate aortic stenosis in 2013, mild to moderate aortic insufficiency and 2013  PLAN:  I discussed the case with Dr. Wynetta Emery.  The surgery is not an emergency.  Given that her symptoms of shortness of breath started after the echocardiogram in February of 2013, and she has not had an echocardiogram since that time, we will perform an echocardiogram.  I discussed this with Dr. Mayford Knife as well.  She will arrange for an echo in our office within the next few days.  Assuming that her aortic valve has not progressed to symptomatic severe aortic stenosis, her surgery will hopefully happen next week.  Corky Crafts., MD  08/12/2012  11:56 AM

## 2012-08-12 NOTE — Discharge Summary (Signed)
  Patient was brought to the hospital to undergo a posterior cervical decompression and fusion. On evaluation in the holding area anesthesia had some additional concerns with regard to her history of aortic stenosis the consults Dr. French Ana Turner's office in one of her partners who is in the hospital came by and saw are evaluated her and felt it was worth pursuing any further more detailed evaluation with an echocardiogram. Severe forward head we had to cancel her surgery she'll be discharged her scheduled outpatient followup for echocardiogram and subsequent rescheduling her surgery. I extensively went overall this with the patient and her family and we will be in contact. She did not require any refills in her medications.

## 2012-08-12 NOTE — Progress Notes (Signed)
Iv dced intact,pt. Home with husband, ambulatory

## 2012-08-17 ENCOUNTER — Encounter (HOSPITAL_COMMUNITY): Admission: RE | Payer: Self-pay | Source: Ambulatory Visit

## 2012-08-17 ENCOUNTER — Ambulatory Visit (HOSPITAL_COMMUNITY)
Admission: RE | Admit: 2012-08-17 | Payer: Federal, State, Local not specified - PPO | Source: Ambulatory Visit | Admitting: Neurosurgery

## 2012-08-17 SURGERY — POSTERIOR CERVICAL FUSION/FORAMINOTOMY LEVEL 4
Anesthesia: General

## 2012-08-19 ENCOUNTER — Other Ambulatory Visit: Payer: Self-pay | Admitting: Cardiology

## 2012-08-20 ENCOUNTER — Encounter: Payer: Self-pay | Admitting: Cardiology

## 2012-08-20 ENCOUNTER — Other Ambulatory Visit: Payer: Self-pay | Admitting: Cardiology

## 2012-08-20 NOTE — H&P (Signed)
Office Visit     Patient: Anna Lawson Provider: Armanda Magic, MD  DOB: Mar 27, 1951 Age: 62 Y Sex: Female Date: 08/18/2012  Phone: [phone]   Address: [address] [place], [place], [id number]  Pcp: CYNTHIA WHITE       Subjective:     CC:    1. TT/review ECHO results.        HPI:  General:  The patient presets today for followup of her AS. She is having problems now with increased SOB. She occasionally gets a pressure in her chest when she walks up the stairs and now gets SOB making up her beds. She has had some LE edema. She rarely will have her heart race. She has been having dizziness but no syncope..        ROS:  See HPI, A twelve system review was perfomed at today's visit. For pertinent positives and negatives see HPI.       Medical History: Asthma, aortic stenosis/regurg with bicuspid aortic valve, SBE prophylaxis, ECHO 8/06 and 09, cardiolyte abnormal, nl cath 06 [name], Edema, Hyperlipidemia, Vitamin D deficiency, normal coronary arteries by cath 2006 with mild AS/AI at that time, partial empty sella, possible meningioma on MRI 9/13, severe cervical spinal stenosis, Dr Wynetta Emery, memory loss, Dr Vickey Huger .        Family History: Father: deceased cancer, bladder Mother: deceased diabetes (type 1), heart attack 11s Paternal Grand Father: deceased Paternal Grand Mother: deceased Maternal Grand Father: deceased Maternal Grand Mother: deceased Brother 1: deceased diabetes type 1 Brother2: died at 6 MVA        Social History:  General:  History of smoking  cigarettes: Former smoker Quit in year 2004 Pack-year Hx: 19 Smoking: quit 2004.  Alcohol: rare wine.  Recreational drug use: no.  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  Marital Status: Married [name].  Children: [name], [name] [facility] to contact them).  Religion: faith important, not active in church.        Medications: Prilosec OTC 20 MG  Tablet Delayed Release 1 tablet as needed, Aspirin 81 MG Tablet 1 tablet Once a day, Pravachol 40 Tablet TAKE TWO TABLETS BY MOUTH ONE TIME PER DAY IN THE EVENING Not taking due to muscle spasms, Hydrochlorothiazide 25MG  Tablet TAKE ONE TABLET BY MOUTH EVERY DAY AS NEEDED FOR SWELLING , Tramadol HCl 50 MG Tablet 1 tablet as needed for pain every 6 hrs, Aricept 5 MG Tablet 1 tablet at bedtime Once a day, Medication List reviewed and reconciled with the patient       Allergies: Codeine Sulfate: vomiting: Side Effects, Adhesive Tape 1"x5yd.       Objective:     Vitals: Wt 218.8, Wt change 8.4 lb, Ht 60", BMI 42.73, Pulse sitting 88, BP sitting 112/82.       Examination:  Cardiology, General:  GENERAL APPEARANCE: pleasant, NAD.  HEENT: unremarkable.  CAROTID UPSTROKE: normal, no bruit.  JVD: flat.  HEART SOUNDS: regular, normal S1, S2, no S3 or S4.  MURMUR: 2/6 SM late peaking with no audible S2.  LUNGS: no rales or wheezes.  ABDOMEN: soft, non tender, positive bowel sounds, no masses felt.  EXTREMITIES: no leg edema.  PERIPHERAL PULSES: 2 plus bilateral.        Assessment:     Assessment:  1. Aortic valve disorders - 424.1 (Primary)  2. SOB (shortness of breath) - 786.05  3. Chest pain - 786.50    Plan:  1. Aortic valve disorders  LAB: Basic Metabolic Normal    GLUCOSE 95 62-95 - mg/dL    BUN 11 2-84 - mg/dL    CREATININE 1.32 4.40-1.02 - mg/dl    eGFR (NON-AFRICAN AMERICAN) 82 >60 - calc    eGFR (AFRICAN AMERICAN) 99 >60 - calc    SODIUM 138 136-145 - mmol/L    POTASSIUM 4.8 3.5-5.5 - mmol/L    CHLORIDE 104 98-107 - mmol/L    C02 31 22-32 - mmol/L    ANION GAP 8.6 6.0-20.0 - mmol/L    CALCIUM 9.5 8.6-10.3 - mg/dL     TURNER,TRACI M 72/53/6644 05:52:33 PM > Corson,Danielle 08/19/2012 08:31:11 AM > Pt is aware    LAB: CBC with Diff Normal    WBC 8.7 4.0-11.0 - K/ul    RBC 5.08 4.20-5.40 - M/uL    HGB 14.7 12.0-16.0 - g/dL    HCT 03.4 74.2-59.5 - %    MCH 28.9  27.0-33.0 - pg    MPV 6.9 7.5-10.7 - fL L   MCV 88.6 81.0-99.0 - fL    MCHC 32.6 32.0-36.0 - g/dL    RDW 63.8 75.6-43.3 - %    NRBC# 0.01 -    PLT 271 150-400 - K/uL    NEUT % 71.2 43.3-71.9 - %    NRBC% 0.10 - %    LYMPH% 18.7 16.8-43.5 - %    MONO % 7.3 4.6-12.4 - %    EOS % 2.0 0.0-7.8 - %    BASO % 0.8 0.0-1.0 - %    NEUT # 6.2 1.9-7.2 - K/uL    LYMPH# 1.60 1.10-2.70 - K/uL    MONO # 0.6 0.3-0.8 - K/uL    EOS # 0.2 0.0-0.6 - K/uL    BASO # 0.1 0.0-0.1 - K/uL     Corson,Danielle 08/18/2012 04:30:43 PM > To Dr. Mayford Knife to timestamp. Pt is aware or results via vm per DPR. Janina Mayo 08/18/2012 05:52:45 PM >    LAB: PT (Prothrombin Time) (295188) Normal    Prothrombin Time 10.1 9.1-12.0 - SEC    INR 0.9 0.8-1.2 -     TURNER,TRACI M 08/19/2012 08:00:32 AM > Corson,Danielle 08/19/2012 08:31:38 AM >FOr Cath    She has severe AS by echo and now has symptoms of chest pain and SOB. I have recommended that we proceed with right and left heart cath and proceed with surgical evaluation. , Risks and benefits of cardiac catheterization have been reviewed including risk of stroke, heart attack, death, bleeding, renal impariment and arterial damage. There was ample oppurtuny to answer questions. Alternatives were discussed. Patient understands and wishes to proceed.        Procedures:  Venipuncture:  Venipuncture: Gasanova,Svetlana 08/18/2012 03:38:06 PM > , performed in right arm.        Immunizations:        Labs:        Procedure Codes: 41660 ECL BMP, 85025 ECL CBC PLATELET DIFF, 63016 BLOOD COLLECTION ROUTINE VENIPUNCTURE       Preventive:         Follow Up: cath      Provider: Armanda Magic, MD  Patient: Anna, Lawson DOB: 03-Apr-1951 Date: 08/18/2012

## 2012-08-21 ENCOUNTER — Encounter (HOSPITAL_BASED_OUTPATIENT_CLINIC_OR_DEPARTMENT_OTHER)
Admission: RE | Disposition: A | Discharge status: Hospice | Payer: Self-pay | Source: Ambulatory Visit | Attending: Cardiology

## 2012-08-21 ENCOUNTER — Inpatient Hospital Stay (HOSPITAL_BASED_OUTPATIENT_CLINIC_OR_DEPARTMENT_OTHER)
Admission: RE | Admit: 2012-08-21 | Discharge: 2012-08-21 | Disposition: A | Discharge status: Hospice | Payer: Federal, State, Local not specified - PPO | Source: Ambulatory Visit | Attending: Cardiology | Admitting: Cardiology

## 2012-08-21 DIAGNOSIS — I359 Nonrheumatic aortic valve disorder, unspecified: Secondary | ICD-10-CM | POA: Insufficient documentation

## 2012-08-21 DIAGNOSIS — R0602 Shortness of breath: Secondary | ICD-10-CM | POA: Insufficient documentation

## 2012-08-21 DIAGNOSIS — Z01810 Encounter for preprocedural cardiovascular examination: Secondary | ICD-10-CM

## 2012-08-21 DIAGNOSIS — R0600 Dyspnea, unspecified: Secondary | ICD-10-CM

## 2012-08-21 DIAGNOSIS — I350 Nonrheumatic aortic (valve) stenosis: Secondary | ICD-10-CM

## 2012-08-21 DIAGNOSIS — R079 Chest pain, unspecified: Secondary | ICD-10-CM | POA: Insufficient documentation

## 2012-08-21 HISTORY — PX: CARDIAC CATHETERIZATION: SHX172

## 2012-08-21 LAB — POCT I-STAT 3, ART BLOOD GAS (G3+)
Bicarbonate: 28 mEq/L — ABNORMAL HIGH (ref 20.0–24.0)
TCO2: 29 mmol/L (ref 0–100)
pH, Arterial: 7.391 (ref 7.350–7.450)
pO2, Arterial: 66 mmHg — ABNORMAL LOW (ref 80.0–100.0)

## 2012-08-21 SURGERY — JV LEFT HEART CATHETERIZATION WITH CORONARY ANGIOGRAM
Anesthesia: Moderate Sedation

## 2012-08-21 MED ORDER — ASPIRIN 81 MG PO CHEW
324.0000 mg | CHEWABLE_TABLET | ORAL | Status: AC
  Administered 2012-08-21: 324 mg via ORAL

## 2012-08-21 MED ORDER — SODIUM CHLORIDE 0.9 % IJ SOLN: 3.0000 mL | INTRAMUSCULAR | Status: DC | PRN

## 2012-08-21 MED ORDER — SODIUM CHLORIDE 0.9 % IV SOLN: 1.0000 mL/kg/h | INTRAVENOUS | Status: DC

## 2012-08-21 MED ORDER — SODIUM CHLORIDE 0.9 % IJ SOLN: 3.0000 mL | Freq: Two times a day (BID) | INTRAMUSCULAR | Status: DC

## 2012-08-21 MED ORDER — DIAZEPAM 5 MG PO TABS
5.0000 mg | ORAL_TABLET | ORAL | Status: AC
  Administered 2012-08-21: 5 mg via ORAL

## 2012-08-21 MED ORDER — SODIUM CHLORIDE 0.9 % IV SOLN
INTRAVENOUS | Status: DC
  Administered 2012-08-21: 10:00:00 via INTRAVENOUS

## 2012-08-21 MED ORDER — ACETAMINOPHEN 325 MG PO TABS: 650.0000 mg | ORAL_TABLET | ORAL | Status: DC | PRN

## 2012-08-21 MED ORDER — SODIUM CHLORIDE 0.9 % IV SOLN: 250.0000 mL | INTRAVENOUS | Status: DC | PRN

## 2012-08-21 MED ORDER — ONDANSETRON HCL 4 MG/2ML IJ SOLN: 4.0000 mg | Freq: Four times a day (QID) | INTRAMUSCULAR | Status: DC | PRN

## 2012-08-21 NOTE — CV Procedure (Addendum)
PROCEDURE:  Left heart catheterization with selective coronary angiography, left ventriculogram.  INDICATIONS:  Severe AS with chest pain and SOB  The risks, benefits, and details of the procedure were explained to the patient.  The patient verbalized understanding and wanted to proceed.  Informed written consent was obtained.  PROCEDURE TECHNIQUE:  After Xylocaine anesthesia a 70F sheath was placed in the right femoral artery with a single anterior needle wall stick.   Left coronary angiography was done using a Judkins L4 guide catheter.  Right coronary angiography was done using a Judkins R4 guide catheter.  Left ventriculography was done using a pigtail catheter.    CONTRAST:  Total of 50cc.  COMPLICATIONS:  None.    HEMODYNAMICS:  Aortic pressure was 124/80mmHg  ANGIOGRAPHIC DATA:   The left main coronary artery is widely patent and bifurcated into an LAD and left circumflex arteries.    The left anterior descending artery is widely patent and gives rise to a first diagonal which is moderate in size and was widely patent.  The LAD then gives rise to a second small diagonal which was patent.    The left circumflex artery is widely patent.  It gives rise to a small first OM which is patent and then gives off a small to moderate sized OM2 which is patent.  The ongoing left circumflex traverses the AV groove and is patent.  Distally it gives rise to a small OM 3 which is patent.    The right coronary artery is widely patent.  It gives rise to a moderate sized acute RV marginal branch which is patent.  The ongoing RCA is patent and bifurctaes into a PDA and PL branches.    LEFT VENTRICULOGRAM:  Not performed due to severe AS. IMPRESSIONS:  1. Normal left main coronary artery. 2. Normal left anterior descending artery and its branches. 3. Normal left circumflex artery and its branches. 4. Normal right coronary artery. 5. Severe Aortic stenosis by cath with Peak AV velocity 3.74m/sec, mean  AV gradeitn and AVA calculated at 0.8cm 2.  RECOMMENDATION:   1.  D/C home after IV fluids and bedrest completed 2.  CVTS surgery consult as outpatient. 3.  Followup in clinic in 2 weeks for groin check.

## 2012-08-21 NOTE — Progress Notes (Signed)
Bedrest begins @ 1115, tegaderm dressing applied to right groin site by Leitha Schuller, site level 0.

## 2012-08-21 NOTE — H&P (View-Only) (Signed)
Office Visit     Patient: Anna Lawson, Anna Lawson Provider: Laithan Conchas, MD  DOB: 09/23/1950 Age: 62 Y Sex: Female Date: 08/18/2012  Phone: [phone]   Address: [address], [place], [place]  Pcp: CYNTHIA WHITE       Subjective:     CC:    1. TT/review ECHO results.        HPI:  General:  The patient presets today for followup of her AS. She is having problems now with increased SOB. She occasionally gets a pressure in her chest when she walks up the stairs and now gets SOB making up her beds. She has had some LE edema. She rarely will have her heart race. She has been having dizziness but no syncope..        ROS:  See HPI, A twelve system review was perfomed at today's visit. For pertinent positives and negatives see HPI.       Medical History: Asthma, aortic stenosis/regurg with bicuspid aortic valve, SBE prophylaxis, ECHO 8/06 and 09, cardiolyte abnormal, nl cath 06 Andalyn Iosefa Weintraub, Edema, Hyperlipidemia, Vitamin D deficiency, normal coronary arteries by cath 2006 with mild AS/AI at that time, partial empty sella, possible meningioma on MRI 9/13, severe cervical spinal stenosis, Dr Cram, memory loss, Dr Dohmeier .        Family History: Father: deceased cancer, bladder Mother: deceased diabetes (type 1), heart attack 50s Paternal Grand Father: deceased Paternal Grand Mother: deceased Maternal Grand Father: deceased Maternal Grand Mother: deceased Brother 1: deceased diabetes type 1 Brother2: died at 22 MVA        Social History:  General:  History of smoking  cigarettes: Former smoker Quit in year 2004 Pack-year Hx: 20 Smoking: quit 2004.  Alcohol: rare wine.  Recreational drug use: no.  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  Marital Status: Married James.  Children: Christopher Cozzi, Timothy, Robert (Ok to contact them).  Religion: faith important, not active in church.        Medications: Prilosec OTC 20 MG  Tablet Delayed Release 1 tablet as needed, Aspirin 81 MG Tablet 1 tablet Once a day, Pravachol 40 Tablet TAKE TWO TABLETS BY MOUTH ONE TIME PER DAY IN THE EVENING Not taking due to muscle spasms, Hydrochlorothiazide 25MG Tablet TAKE ONE TABLET BY MOUTH EVERY DAY AS NEEDED FOR SWELLING , Tramadol HCl 50 MG Tablet 1 tablet as needed for pain every 6 hrs, Aricept 5 MG Tablet 1 tablet at bedtime Once a day, Medication List reviewed and reconciled with the patient       Allergies: Codeine Sulfate: vomiting: Side Effects, Adhesive Tape 1"x5yd.       Objective:     Vitals: Wt 218.8, Wt change 8.4 lb, Ht 60", BMI 42.73, Pulse sitting 88, BP sitting 112/82.       Examination:  Cardiology, General:  GENERAL APPEARANCE: pleasant, NAD.  HEENT: unremarkable.  CAROTID UPSTROKE: normal, no bruit.  JVD: flat.  HEART SOUNDS: regular, normal S1, S2, no S3 or S4.  MURMUR: 2/6 SM late peaking with no audible S2.  LUNGS: no rales or wheezes.  ABDOMEN: soft, non tender, positive bowel sounds, no masses felt.  EXTREMITIES: no leg edema.  PERIPHERAL PULSES: 2 plus bilateral.        Assessment:     Assessment:  1. Aortic valve disorders - 424.1 (Primary)  2. SOB (shortness of breath) - 786.05  3. Chest pain - 786.50    Plan:       1. Aortic valve disorders  LAB: Basic Metabolic Normal    GLUCOSE 95 70-99 - mg/dL    BUN 11 6-26 - mg/dL    CREATININE 0.72 0.60-1.30 - mg/dl    eGFR (NON-AFRICAN AMERICAN) 82 >60 - calc    eGFR (AFRICAN AMERICAN) 99 >60 - calc    SODIUM 138 136-145 - mmol/L    POTASSIUM 4.8 3.5-5.5 - mmol/L    CHLORIDE 104 98-107 - mmol/L    C02 31 22-32 - mmol/L    ANION GAP 8.6 6.0-20.0 - mmol/L    CALCIUM 9.5 8.6-10.3 - mg/dL     Karri Kallenbach M 08/18/2012 05:52:33 PM > Corson,Danielle 08/19/2012 08:31:11 AM > Pt is aware    LAB: CBC with Diff Normal    WBC 8.7 4.0-11.0 - K/ul    RBC 5.08 4.20-5.40 - M/uL    HGB 14.7 12.0-16.0 - g/dL    HCT 45.1 37.0-47.0 - %    MCH 28.9  27.0-33.0 - pg    MPV 6.9 7.5-10.7 - fL L   MCV 88.6 81.0-99.0 - fL    MCHC 32.6 32.0-36.0 - g/dL    RDW 13.2 11.5-15.5 - %    NRBC# 0.01 -    PLT 271 150-400 - K/uL    NEUT % 71.2 43.3-71.9 - %    NRBC% 0.10 - %    LYMPH% 18.7 16.8-43.5 - %    MONO % 7.3 4.6-12.4 - %    EOS % 2.0 0.0-7.8 - %    BASO % 0.8 0.0-1.0 - %    NEUT # 6.2 1.9-7.2 - K/uL    LYMPH# 1.60 1.10-2.70 - K/uL    MONO # 0.6 0.3-0.8 - K/uL    EOS # 0.2 0.0-0.6 - K/uL    BASO # 0.1 0.0-0.1 - K/uL     Corson,Danielle 08/18/2012 04:30:43 PM > To Dr. Patric Vanpelt to timestamp. Pt is aware or results via vm per DPR. Dinia Joynt M 08/18/2012 05:52:45 PM >    LAB: PT (Prothrombin Time) (005199) Normal    Prothrombin Time 10.1 9.1-12.0 - SEC    INR 0.9 0.8-1.2 -     Rosalia Mcavoy M 08/19/2012 08:00:32 AM > Corson,Danielle 08/19/2012 08:31:38 AM >FOr Cath    She has severe AS by echo and now has symptoms of chest pain and SOB. I have recommended that we proceed with right and left heart cath and proceed with surgical evaluation. , Risks and benefits of cardiac catheterization have been reviewed including risk of stroke, heart attack, death, bleeding, renal impariment and arterial damage. There was ample oppurtuny to answer questions. Alternatives were discussed. Patient understands and wishes to proceed.        Procedures:  Venipuncture:  Venipuncture: Gasanova,Svetlana 08/18/2012 03:38:06 PM > , performed in right arm.        Immunizations:        Labs:        Procedure Codes: 80048 ECL BMP, 85025 ECL CBC PLATELET DIFF, 36415 BLOOD COLLECTION ROUTINE VENIPUNCTURE       Preventive:         Follow Up: cath      Provider: May Manrique, MD  Patient: Richner, Layanna DOB: 11/17/1950 Date: 08/18/2012     

## 2012-08-21 NOTE — Interval H&P Note (Signed)
History and Physical Interval Note:  08/21/2012 10:00 AM  Anna Lawson  has presented today for surgery, with the diagnosis of CP  The various methods of treatment have been discussed with the patient and family. After consideration of risks, benefits and other options for treatment, the patient has consented to  Procedure(s) (LRB) with comments: JV LEFT HEART CATHETERIZATION WITH CORONARY ANGIOGRAM (N/A) as a surgical intervention .  The patient's history has been reviewed, patient examined, no change in status, stable for surgery.  I have reviewed the patient's chart and labs.  Questions were answered to the patient's satisfaction.     Anna Lawson

## 2012-09-04 ENCOUNTER — Encounter: Payer: Self-pay | Admitting: Cardiothoracic Surgery

## 2012-09-04 ENCOUNTER — Other Ambulatory Visit: Payer: Self-pay

## 2012-09-04 ENCOUNTER — Institutional Professional Consult (permissible substitution) (INDEPENDENT_AMBULATORY_CARE_PROVIDER_SITE_OTHER): Payer: Federal, State, Local not specified - PPO | Admitting: Cardiothoracic Surgery

## 2012-09-04 VITALS — BP 136/82 | HR 80 | Resp 16 | Ht <= 58 in | Wt 216.0 lb

## 2012-09-04 DIAGNOSIS — I359 Nonrheumatic aortic valve disorder, unspecified: Secondary | ICD-10-CM

## 2012-09-04 DIAGNOSIS — I059 Rheumatic mitral valve disease, unspecified: Secondary | ICD-10-CM

## 2012-09-04 DIAGNOSIS — I350 Nonrheumatic aortic (valve) stenosis: Secondary | ICD-10-CM

## 2012-09-04 NOTE — Progress Notes (Signed)
PCP is Cala Bradford, MD Referring Provider is Quintella Reichert, MD  Chief Complaint  Patient presents with  . Shortness of Breath    eval and treat...ECHO, CATH  . Aortic Stenosis    HPI: 62 year old short obese female former smoker with progressive and significant shortness of breath with exertion. Known history of aortic stenosis. 2-D echo last year showing moderate aortic stenosis. 2-D echo last month shows severe aortic stenosis. Cardiac catheterization shows normal coronaries. Valve not crossed with catheter. Echo demonstrates probable bicuspid valve with a calculated area of 0.7 there is moderate aortic insufficiency. Left anterior outflow tract measures 20 mm. There is no significant mitral valve disease. LV function is normal by echo with EF of 64%.  The patient was scheduled for cervical spine decompression and fusion last month but this was canceled 1 there were significant concerns over general anesthesia with her aortic stenosis. Her cardiologist is now recommended she undergo aortic valve replacement prior to cervical spine surgery. The patient's ascending aorta on 2-D echo appears to be dilated and a CTA of the thoracic aorta is pending. The patient has not had a dental exam in over 2 years and a dental clinic appointment will be arranged prior to elective valve replacement surgery. Patient has had no previous major surgery and has not had diabetes or other chronic illnesses. We would plan on a mechanical aVR do to her young age and ability to tolerate Coumadin.   Past Medical History  Diagnosis Date  . Spinal stenosis   . Hyperlipidemia   . Heart murmur   . Aortic stenosis     bicuspid AV, mid to mod AR, mod AS by 09/17/11 echo [facility])  . Shortness of breath     all time  . Ankle edema     takes HCTZ  . Dementia     a little  . Constipation   . History of blood transfusion   . Pre-operative cardiovascular examination   . Asthma     Past Surgical History   Procedure Date  . Cardiac catheterization     03/26/05 [facility]): normal coronaries, normal LV function, mild AS/AI.  No MR/MS.  Normal PA pressures and hemodynamics.    Family History  Problem Relation Age of Onset  . Emphysema Maternal Grandmother     never smoked  . Emphysema Paternal Uncle     never smoked  . Heart disease Mother     AGE.Marland KitchenMarland KitchenIN HER 50'S  . Diabetes Mother     TYPE 1  . Bladder Cancer Father   . Diabetes Brother     TYPE I   no family history of aortic stenosis or heart valve surgery  Social History History  Substance Use Topics  . Smoking status: Former Smoker -- 1.0 packs/day for 35 years    Types: Cigarettes    Quit date: 07/29/2002  . Smokeless tobacco: Never Used  . Alcohol Use: No    Current Outpatient Prescriptions  Medication Sig Dispense Refill  . aspirin 81 MG tablet Take 81 mg by mouth daily.      Marland Kitchen donepezil (ARICEPT) 5 MG tablet Take 5 mg by mouth daily.       . hydrochlorothiazide (HYDRODIURIL) 25 MG tablet Take 25 mg by mouth daily.      Marland Kitchen omeprazole (PRILOSEC OTC) 20 MG tablet Take 20 mg by mouth daily.       . polyethylene glycol (MIRALAX / GLYCOLAX) packet Take 17 g by mouth daily as  needed. For constipation      . traMADol (ULTRAM) 50 MG tablet Take 50 mg by mouth every 6 (six) hours as needed. For pain       No current facility-administered medications for this visit.   Facility-Administered Medications Ordered in Other Visits  Medication Dose Route Frequency Provider Last Rate Last Dose  . lactated ringers infusion    Continuous PRN Tyrone Nine, CRNA        Allergies  Allergen Reactions  . Adhesive (Tape)     blisters  . Codeine Nausea And Vomiting    Review of Systems General the patient is gradually decreased in size from 5 feet 4 foot 9 over the past 3 years She denies any active dental problems but has not seen a dentist in 2 years Thoracic review is negative for trauma recent upper respiratory infection  the patient was evaluated prior to planned cervical spine surgery by Dr. Sherene Sires and apparently had PFTs which I cannot find in the record. Fluoroscopy upper hemidiaphragms were normal Cardiac review is positive for history of a murmur since 2006 no history of rheumatic heart disease as a child no history of arrhythmia or coronaries are clean GI is negative for hepatitis jaundice blood per Endocrine negative for diabetes Vascular negative for DVT claudication or TIA Neurologic negative her stroke or seizure  BP 136/82  Pulse 80  Resp 16  Ht 4\' 9"  (1.448 m)  Wt 216 lb (97.977 kg)  BMI 46.74 kg/m2  SpO2 98% Physical Exam  Gen. short obese Caucasian female no acute distress accompanied by family HEENT normocephalic pupils equal dentition adequate but several missing teeth Neck without JVD mass or bruit Lymphatics no palpable nodes in the neck Thorax diminished breath sounds bilaterally no wheezing Cardiac 3/6 systolic ejection murmur regular rhythm Abdomen obese soft without pulsatile mass Extremities no clubbing cyanosis or edema or tenderness Vascular pulses intact in the extremities Neurologic no focal motor deficit   Diagnostic Tests: 2-D echo from [facility] cardiology and coronary arteriogram is reviewed. Evidence of severe aortic stenosis mild aortic insufficiency her LV function no significant CAD potential dilatation of the ascending aorta with a bicuspid valve  Impression: Proceed with evaluation including finding the previously performed PFTs due to her high-risk for poorly complications after sternotomy for her short obese stature, dental evaluation, and CTA to it daily potential location of the ascending aorta  Plan: Return in one to 2 weeks after above studies

## 2012-09-04 NOTE — Patient Instructions (Signed)
U. will need a dental exam which will be arranged at [facility] He will need a CT scan of the chest to evaluate dilatation of the thoracic aorta U. will be scheduled for aortic valve replacement in the near future

## 2012-09-09 ENCOUNTER — Encounter (HOSPITAL_COMMUNITY): Payer: Self-pay | Admitting: Dentistry

## 2012-09-09 ENCOUNTER — Ambulatory Visit (HOSPITAL_COMMUNITY): Payer: Self-pay | Admitting: Dentistry

## 2012-09-09 VITALS — BP 123/75 | HR 67 | Temp 98.0°F

## 2012-09-09 DIAGNOSIS — K08109 Complete loss of teeth, unspecified cause, unspecified class: Secondary | ICD-10-CM

## 2012-09-09 DIAGNOSIS — K083 Retained dental root: Secondary | ICD-10-CM

## 2012-09-09 DIAGNOSIS — I359 Nonrheumatic aortic valve disorder, unspecified: Secondary | ICD-10-CM

## 2012-09-09 DIAGNOSIS — K0530 Chronic periodontitis, unspecified: Secondary | ICD-10-CM

## 2012-09-09 DIAGNOSIS — Z0189 Encounter for other specified special examinations: Secondary | ICD-10-CM

## 2012-09-09 DIAGNOSIS — F409 Phobic anxiety disorder, unspecified: Secondary | ICD-10-CM

## 2012-09-09 DIAGNOSIS — K0889 Other specified disorders of teeth and supporting structures: Secondary | ICD-10-CM

## 2012-09-09 DIAGNOSIS — K036 Deposits [accretions] on teeth: Secondary | ICD-10-CM

## 2012-09-09 DIAGNOSIS — Z954 Presence of other heart-valve replacement: Secondary | ICD-10-CM

## 2012-09-09 DIAGNOSIS — K045 Chronic apical periodontitis: Secondary | ICD-10-CM

## 2012-09-09 NOTE — Progress Notes (Signed)
DENTAL CONSULTATION  Date of Consultation:  09/09/2012 Patient Name:   Anna Lawson Date of Birth:   [birth year] Medical Record Number: [id number]  VITALS: BP 123/75  Pulse 67  Temp(Src) 98 F (36.7 C) (Oral)   CHIEF COMPLAINT: Patient referred for a medically necessary preaortic valve replacement dental protocol evaluation.  HPI: Anna Lawson is a 62 year old female recently diagnosed with severe aortic stenosis.  Patient with anticipated aortic valve replacement with Dr. Donata Clay. Patient is now seen as part of a preaortic valve replacement protocol examination.  Patient currently denies having acute pulpitis, abscesses or swellings.  Patient does give a history of some tenderness involving the maxillary anterior area numbers 9.  This may be related to a retained roots in the area of #9 that was noted approximately 4 years ago at the time of the dental extraction.  Patient indicates that she had all remaining teeth extracted at that time with insertion of an immediate upper complete denture.  The denture was fabricated by Dr. Betti Cruz she was unsure of the name of the surgeon that performed the extractions.  Patient had the upper complete denture relined actually one month after the initial date of insertion. Patient indicates that the upper complete denture is ill fitting and she has no lower denture.  Patient has not seen a dentist since that time.  Patient Active Problem List  Diagnosis  . Dyspnea  . Aortic stenosis  . Pre-operative cardiovascular examination   PMH: Past Medical History  Diagnosis Date  . Spinal stenosis   . Hyperlipidemia   . Heart murmur   . Aortic stenosis     bicuspid AV, mid to mod AR, mod AS by 09/17/11 echo [facility])  . Shortness of breath     all time  . Ankle edema     takes HCTZ  . Dementia     a little  . Constipation   . History of blood transfusion   . Pre-operative cardiovascular examination   . Asthma     PSH: Past  Surgical History  Procedure Laterality Date  . Cardiac catheterization      03/26/05 [facility]): normal coronaries, normal LV function, mild AS/AI.  No MR/MS.  Normal PA pressures and hemodynamics.    ALLERGIES: Allergies  Allergen Reactions  . Adhesive (Tape)     blisters  . Codeine Nausea And Vomiting    MEDICATIONS: Current Outpatient Prescriptions  Medication Sig Dispense Refill  . aspirin 81 MG tablet Take 81 mg by mouth daily.      Marland Kitchen donepezil (ARICEPT) 5 MG tablet Take 5 mg by mouth daily.       . hydrochlorothiazide (HYDRODIURIL) 25 MG tablet Take 25 mg by mouth daily.      Marland Kitchen ibuprofen (ADVIL,MOTRIN) 200 MG tablet Take 200 mg by mouth every 6 (six) hours as needed for pain.      Marland Kitchen omeprazole (PRILOSEC OTC) 20 MG tablet Take 20 mg by mouth daily.       . polyethylene glycol (MIRALAX / GLYCOLAX) packet Take 17 g by mouth daily as needed. For constipation      . traMADol (ULTRAM) 50 MG tablet Take 50 mg by mouth every 6 (six) hours as needed. For pain       No current facility-administered medications for this visit.   Facility-Administered Medications Ordered in Other Visits  Medication Dose Route Frequency Provider Last Rate Last Dose  . lactated ringers infusion    Continuous PRN Zella Ball  Ronnette Hila, CRNA        LABS: Lab Results  Component Value Date   WBC 8.0 08/07/2012   HGB 14.8 08/07/2012   HCT 44.4 08/07/2012   MCV 89.3 08/07/2012   PLT 252 08/07/2012      Component Value Date/Time   NA 140 08/07/2012 0941   K 4.0 08/07/2012 0941   CL 100 08/07/2012 0941   CO2 29 08/07/2012 0941   GLUCOSE 101* 08/07/2012 0941   BUN 11 08/07/2012 0941   CREATININE 0.70 08/07/2012 0941   CALCIUM 9.4 08/07/2012 0941   GFRNONAA >90 08/07/2012 0941   GFRAA >90 08/07/2012 0941   No results found for this basename: INR, PROTIME   No results found for this basename: PTT    SOCIAL HISTORY: History   Social History  . Marital Status: Married    Spouse Name: N/A    Number of  Children: 3  . Years of Education: N/A   Occupational History  . LPN    Social History Main Topics  . Smoking status: Former Smoker -- 1.00 packs/day for 35 years    Types: Cigarettes    Quit date: 07/29/2002  . Smokeless tobacco: Never Used  . Alcohol Use: No  . Drug Use: No  . Sexually Active: Not on file   Other Topics Concern  . Not on file   Social History Narrative  . No narrative on file    FAMILY HISTORY: Family History  Problem Relation Age of Onset  . Emphysema Maternal Grandmother     never smoked  . Emphysema Paternal Uncle     never smoked  . Heart disease Mother     AGE.Marland KitchenMarland KitchenIN HER 50'S  . Diabetes Mother     TYPE 1  . Bladder Cancer Father   . Diabetes Brother     TYPE I     REVIEW OF SYSTEMS: Reviewed with patient and included a dental consultation record.  DENTAL HISTORY: CHIEF COMPLAINT: Patient referred for a medically necessary preaortic valve replacement dental protocol evaluation.  HPI: Anna Lawson is a 62 year old female recently diagnosed with severe aortic stenosis.  Patient with anticipated aortic valve replacement with Dr. Donata Clay. Patient is now seen as part of a preaortic valve replacement protocol examination.  Patient currently denies having acute pulpitis, abscesses or swellings.  Patient does give a history of some tenderness involving the maxillary anterior area numbers 9.  This may be related to a retained roots in the area of #9 that was noted approximately 4 years ago at the time of the dental extraction.  Patient indicates that she had all remaining maxillary teeth extracted at that time with insertion of an immediate upper complete denture.  The denture was fabricated by Dr. Betti Cruz, but she was unsure of the name of the surgeon that performed the extractions.  Patient had the upper complete denture relined approximately  one month after the initial date of insertion. Patient indicates that the upper complete denture is ill  fitting and she has no lower partial denture.  Patient has not seen a dentist since that time.  Patient does have some dental phobia.  DENTAL EXAMINATION:  GENERAL: Patient is a well-developed, well-nourished female in no acute distress. HEAD AND NECK: There is no submandibular lymphadenopathy. The patient denies acute TMJ symptoms but has bilateral TMJ crepitus. INTRAORAL EXAM: Patient has normal saliva. Patient has small, bilateral mandibular tori. DENTITION: Patient is missing tooth numbers 1 through 8, 10 through 16, 17,  18, 19, 31, and 32. Patient has a retained root in the area of #9.  PERIODONTAL: She has chronic, advanced periodontal disease with significant plaque and calculus accumulations, generalized gingival recession, and generalized tooth mobility. DENTAL CARIES/SUBOPTIMAL RESTORATIONS: No significant dental caries are noted. ENDODONTIC: Patient currently denies acute pulpitis symptoms. Patient does have periapical pathology associated retained root in the area of #9. CROWN AND BRIDGE: There are no crown restorations noted. PROSTHODONTIC: She has an upper complete denture that is ill fitting. She has no lower partial denture. OCCLUSION: She has a poor occlusal scheme but is stable with the upper complete denture against the lower natural dentition.  RADIOGRAPHIC INTERPRETATION: An orthopantogram was obtained and supplemented with 8 periapical radiographs. There multiple missing teeth. There is retained root in the area of #9. There is periapical pathology associated with the retained root #9. There is moderate to severe bone loss noted. There is radiographic calculus noted.   ASSESSMENTS: 1. Chronic periodontitis with moderate to severe bone loss 2. Retained root segment #9 3. 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 Generalized gingival recession 5. Generalized tooth mobility. 6. Small, bilateral mandibular lingual tori 7. Multiple missing teeth 8. Ill  fitting maxillary complete denture with no lower partial denture 9. 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 Significant cardiovascular compromise with complications up to and including death with anticipated procedures in the operating room with general anesthesia.   PLAN/RECOMMENDATIONS: 1. I discussed the risks, benefits, and complications of various treatment options with the patient in relationship to her medical and dental conditions and anticipated aortic valve replacement and possible chronic anticoagulation if mechanical valve is utilized. We discussed various treatment options to include no treatment, multiple extractions with alveoloplasty, pre-prosthetic surgery as indicated, periodontal therapy, dental restorations, root canal therapy, crown and bridge therapy, implant therapy, and replacement of missing teeth as indicated. The patient currently wishes to proceed with extraction of remaining teeth with alveoloplasty and pre-prosthetic surgery as indicated in the operating room with general anesthesia scheduled for Tuesday, February 18 at 10:10 AM at   [facility].  Aortic valve replacement surgery will then be done at the discretion of Dr. Donata Clay. Patient will then follow up with the dentist of her choice for fabrication of upper and lower complete dentures after adequate healing and once medically stable from the anticipated aortic valve replacement.  2. Discussion of findings with medical team and coordination of future medical and dental care as indicated.   Charlynne Pander, DDS

## 2012-09-09 NOTE — Patient Instructions (Signed)
Patient scheduled for dental procedures in the operating room on Tuesday, 09/15/2012 at 10:10 AM at [facility]. Dr. Kristin Bruins

## 2012-09-10 ENCOUNTER — Encounter (HOSPITAL_COMMUNITY): Payer: Self-pay

## 2012-09-11 ENCOUNTER — Encounter (HOSPITAL_COMMUNITY): Payer: Self-pay

## 2012-09-11 ENCOUNTER — Other Ambulatory Visit (HOSPITAL_COMMUNITY): Payer: Self-pay | Admitting: Dentistry

## 2012-09-11 ENCOUNTER — Encounter (HOSPITAL_COMMUNITY)
Admission: RE | Admit: 2012-09-11 | Discharge: 2012-09-11 | Disposition: A | Discharge status: Home / Self Care | Payer: Federal, State, Local not specified - PPO | Source: Ambulatory Visit | Attending: Dentistry | Admitting: Dentistry

## 2012-09-11 ENCOUNTER — Ambulatory Visit: Payer: Federal, State, Local not specified - PPO

## 2012-09-11 ENCOUNTER — Ambulatory Visit: Payer: Federal, State, Local not specified - PPO | Admitting: Cardiothoracic Surgery

## 2012-09-11 LAB — BASIC METABOLIC PANEL
BUN: 13 mg/dL (ref 6–23)
CO2: 30 mEq/L (ref 19–32)
Calcium: 9.1 mg/dL (ref 8.4–10.5)
Chloride: 100 mEq/L (ref 96–112)
Creatinine, Ser: 0.76 mg/dL (ref 0.50–1.10)
Glucose, Bld: 88 mg/dL (ref 70–99)

## 2012-09-11 LAB — CBC
HCT: 44.6 % (ref 36.0–46.0)
MCH: 30.1 pg (ref 26.0–34.0)
MCHC: 33.9 g/dL (ref 30.0–36.0)
MCV: 88.8 fL (ref 78.0–100.0)
RDW: 13.1 % (ref 11.5–15.5)

## 2012-09-11 NOTE — Patient Instructions (Addendum)
19 Anna Lawson  09/11/2012   Your procedure is scheduled on: 09-15-2012  Report to [facility] at  0740  AM.  Call this number if you have problems the morning of surgery [phone]   Remember:   Do not eat food or drink liquids :After Midnight.     Take these medicines the morning of surgery with A SIP OF WATER: aricept, omeprazole                                SEE [place] PREPARING FOR SURGERY SHEET   Do not wear jewelry, make-up or nail polish.  Do not wear lotions, powders, or perfumes. You may wear deodorant.   Men may shave face and neck.  Do not bring valuables to the hospital.  Contacts, dentures or bridgework may not be worn into surgery.  Leave suitcase in the car. After surgery it may be brought to your room.  For patients admitted to the hospital, checkout time is 11:00 AM the day of discharge.   Patients discharged the day of surgery will not be allowed to drive home.  Name and phone number of your driver: dtr in law or spouse [name] cell [phone]  Special Instructions: N/A   Please read over the following fact sheets that you were given: MRSA Information.  Call Cain Sieve RN pre op nurse if needed [phone]    FAILURE TO FOLLOW THESE INSTRUCTIONS MAY RESULT IN THE CANCELLATION OF YOUR SURGERY. PATIENT SIGNATURE___________________________________________

## 2012-09-11 NOTE — Progress Notes (Signed)
09/11/12 1011  OBSTRUCTIVE SLEEP APNEA  Have you ever been diagnosed with sleep apnea through a sleep study? No  Do you snore loudly (loud enough to be heard through closed doors)?  0  Do you often feel tired, fatigued, or sleepy during the daytime? 1  Has anyone observed you stop breathing during your sleep? 0  Do you have, or are you being treated for high blood pressure? 1  BMI more than 35 kg/m2? 1  Age over 63 years old? 1  Neck circumference greater than 40 cm/18 inches? 0  Gender: 0  Obstructive Sleep Apnea Score 4  Score 4 or greater  Results sent to PCP

## 2012-09-14 ENCOUNTER — Ambulatory Visit
Admission: RE | Admit: 2012-09-14 | Discharge: 2012-09-14 | Disposition: A | Discharge status: Home / Self Care | Payer: Federal, State, Local not specified - PPO | Source: Ambulatory Visit | Attending: Cardiothoracic Surgery | Admitting: Cardiothoracic Surgery

## 2012-09-14 DIAGNOSIS — I359 Nonrheumatic aortic valve disorder, unspecified: Secondary | ICD-10-CM

## 2012-09-14 MED ORDER — IOHEXOL 350 MG/ML SOLN
80.0000 mL | Freq: Once | INTRAVENOUS | Status: AC | PRN
  Administered 2012-09-14: 100 mL via INTRAVENOUS

## 2012-09-14 MED ORDER — CEFAZOLIN SODIUM-DEXTROSE 2-3 GM-% IV SOLR: 2.0000 g | Freq: Once | INTRAVENOUS | Status: DC

## 2012-09-15 ENCOUNTER — Ambulatory Visit (HOSPITAL_COMMUNITY)
Admission: RE | Admit: 2012-09-15 | Discharge: 2012-09-15 | Disposition: A | Discharge status: Home / Self Care | Payer: Federal, State, Local not specified - PPO | Source: Ambulatory Visit | Attending: Dentistry | Admitting: Dentistry

## 2012-09-15 ENCOUNTER — Encounter (HOSPITAL_COMMUNITY): Payer: Self-pay | Admitting: Anesthesiology

## 2012-09-15 ENCOUNTER — Ambulatory Visit (HOSPITAL_COMMUNITY): Payer: Federal, State, Local not specified - PPO | Admitting: Anesthesiology

## 2012-09-15 ENCOUNTER — Encounter (HOSPITAL_COMMUNITY): Payer: Self-pay | Admitting: *Deleted

## 2012-09-15 ENCOUNTER — Encounter (HOSPITAL_COMMUNITY)
Admission: RE | Disposition: A | Discharge status: Home / Self Care | Payer: Self-pay | Source: Ambulatory Visit | Attending: Dentistry

## 2012-09-15 DIAGNOSIS — K0530 Chronic periodontitis, unspecified: Secondary | ICD-10-CM

## 2012-09-15 DIAGNOSIS — M270 Developmental disorders of jaws: Secondary | ICD-10-CM | POA: Diagnosis present

## 2012-09-15 DIAGNOSIS — I350 Nonrheumatic aortic (valve) stenosis: Secondary | ICD-10-CM

## 2012-09-15 DIAGNOSIS — I359 Nonrheumatic aortic valve disorder, unspecified: Secondary | ICD-10-CM | POA: Insufficient documentation

## 2012-09-15 DIAGNOSIS — Z79899 Other long term (current) drug therapy: Secondary | ICD-10-CM | POA: Insufficient documentation

## 2012-09-15 DIAGNOSIS — E785 Hyperlipidemia, unspecified: Secondary | ICD-10-CM | POA: Insufficient documentation

## 2012-09-15 DIAGNOSIS — Z7982 Long term (current) use of aspirin: Secondary | ICD-10-CM | POA: Insufficient documentation

## 2012-09-15 DIAGNOSIS — K045 Chronic apical periodontitis: Secondary | ICD-10-CM

## 2012-09-15 DIAGNOSIS — Z01810 Encounter for preprocedural cardiovascular examination: Secondary | ICD-10-CM

## 2012-09-15 DIAGNOSIS — J45909 Unspecified asthma, uncomplicated: Secondary | ICD-10-CM | POA: Insufficient documentation

## 2012-09-15 DIAGNOSIS — Z01812 Encounter for preprocedural laboratory examination: Secondary | ICD-10-CM | POA: Insufficient documentation

## 2012-09-15 DIAGNOSIS — K083 Retained dental root: Secondary | ICD-10-CM | POA: Diagnosis present

## 2012-09-15 DIAGNOSIS — M278 Other specified diseases of jaws: Secondary | ICD-10-CM

## 2012-09-15 HISTORY — PX: MULTIPLE EXTRACTIONS WITH ALVEOLOPLASTY: SHX5342

## 2012-09-15 SURGERY — MULTIPLE EXTRACTION WITH ALVEOLOPLASTY
Anesthesia: General | Site: Mouth | Wound class: Clean Contaminated

## 2012-09-15 MED ORDER — BUPIVACAINE-EPINEPHRINE PF 0.5-1:200000 % IJ SOLN
INTRAMUSCULAR | Status: AC
  Filled 2012-09-15: qty 7.2

## 2012-09-15 MED ORDER — ROCURONIUM BROMIDE 100 MG/10ML IV SOLN
INTRAVENOUS | Status: DC | PRN
  Administered 2012-09-15: 30 mg via INTRAVENOUS

## 2012-09-15 MED ORDER — 0.9 % SODIUM CHLORIDE (POUR BTL) OPTIME
TOPICAL | Status: DC | PRN
  Administered 2012-09-15: 1000 mL

## 2012-09-15 MED ORDER — LIDOCAINE-EPINEPHRINE 2 %-1:100000 IJ SOLN
INTRAMUSCULAR | Status: DC | PRN
  Administered 2012-09-15: 1.7 mL

## 2012-09-15 MED ORDER — LACTATED RINGERS IV SOLN
INTRAVENOUS | Status: DC
  Administered 2012-09-15: 1000 mL via INTRAVENOUS

## 2012-09-15 MED ORDER — ACETAMINOPHEN 10 MG/ML IV SOLN
INTRAVENOUS | Status: DC | PRN
  Administered 2012-09-15: 1000 mg via INTRAVENOUS

## 2012-09-15 MED ORDER — ACETAMINOPHEN 10 MG/ML IV SOLN
INTRAVENOUS | Status: AC
  Filled 2012-09-15: qty 100

## 2012-09-15 MED ORDER — ONDANSETRON HCL 4 MG/2ML IJ SOLN
INTRAMUSCULAR | Status: DC | PRN
  Administered 2012-09-15: 4 mg via INTRAVENOUS

## 2012-09-15 MED ORDER — DEXAMETHASONE SODIUM PHOSPHATE 10 MG/ML IJ SOLN
INTRAMUSCULAR | Status: DC | PRN
  Administered 2012-09-15: 10 mg via INTRAVENOUS

## 2012-09-15 MED ORDER — NEOSTIGMINE METHYLSULFATE 1 MG/ML IJ SOLN
INTRAMUSCULAR | Status: DC | PRN
  Administered 2012-09-15: 2 mg via INTRAVENOUS
  Administered 2012-09-15: 3 mg via INTRAVENOUS
  Administered 2012-09-15: 2 mg via INTRAVENOUS

## 2012-09-15 MED ORDER — TRAMADOL HCL 50 MG PO TABS: 50.0000 mg | ORAL_TABLET | Freq: Four times a day (QID) | ORAL | Status: DC | PRN

## 2012-09-15 MED ORDER — FENTANYL CITRATE 0.05 MG/ML IJ SOLN
INTRAMUSCULAR | Status: DC | PRN
  Administered 2012-09-15: 100 ug via INTRAVENOUS
  Administered 2012-09-15: 50 ug via INTRAVENOUS

## 2012-09-15 MED ORDER — LIDOCAINE-EPINEPHRINE 2 %-1:100000 IJ SOLN
INTRAMUSCULAR | Status: AC
  Filled 2012-09-15: qty 6.8

## 2012-09-15 MED ORDER — GLYCOPYRROLATE 0.2 MG/ML IJ SOLN
INTRAMUSCULAR | Status: DC | PRN
  Administered 2012-09-15 (×3): 0.2 mg via INTRAVENOUS

## 2012-09-15 MED ORDER — PHENYLEPHRINE HCL 10 MG/ML IJ SOLN
INTRAMUSCULAR | Status: DC | PRN
  Administered 2012-09-15 (×2): 40 ug via INTRAVENOUS
  Administered 2012-09-15: 80 ug via INTRAVENOUS
  Administered 2012-09-15: 40 ug via INTRAVENOUS
  Administered 2012-09-15: 80 ug via INTRAVENOUS
  Administered 2012-09-15: 40 ug via INTRAVENOUS

## 2012-09-15 MED ORDER — FENTANYL CITRATE 0.05 MG/ML IJ SOLN: 25.0000 ug | INTRAMUSCULAR | Status: DC | PRN

## 2012-09-15 MED ORDER — TRAMADOL HCL 50 MG PO TABS
50.0000 mg | ORAL_TABLET | Freq: Four times a day (QID) | ORAL | Status: DC
  Administered 2012-09-15: 50 mg via ORAL
  Filled 2012-09-15 (×8): qty 1

## 2012-09-15 MED ORDER — METOCLOPRAMIDE HCL 5 MG/ML IJ SOLN: 10.0000 mg | Freq: Once | INTRAMUSCULAR | Status: DC | PRN

## 2012-09-15 MED ORDER — BUPIVACAINE-EPINEPHRINE PF 0.5-1:200000 % IJ SOLN
INTRAMUSCULAR | Status: DC | PRN
  Administered 2012-09-15: 7.2 mL

## 2012-09-15 MED ORDER — SUCCINYLCHOLINE CHLORIDE 20 MG/ML IJ SOLN
INTRAMUSCULAR | Status: DC | PRN
  Administered 2012-09-15: 100 mg via INTRAVENOUS

## 2012-09-15 MED ORDER — CEFAZOLIN SODIUM-DEXTROSE 2-3 GM-% IV SOLR
2.0000 g | INTRAVENOUS | Status: AC
  Administered 2012-09-15: 2 g via INTRAVENOUS

## 2012-09-15 MED ORDER — DEXAMETHASONE SODIUM PHOSPHATE 10 MG/ML IJ SOLN: 10.0000 mg | INTRAMUSCULAR | Status: DC

## 2012-09-15 MED ORDER — MIDAZOLAM HCL 5 MG/5ML IJ SOLN
INTRAMUSCULAR | Status: DC | PRN
  Administered 2012-09-15: 2 mg via INTRAVENOUS

## 2012-09-15 MED ORDER — ETOMIDATE 2 MG/ML IV SOLN
INTRAVENOUS | Status: DC | PRN
  Administered 2012-09-15: 8 mg via INTRAVENOUS
  Administered 2012-09-15: 2 mg via INTRAVENOUS

## 2012-09-15 MED ORDER — CEFAZOLIN SODIUM-DEXTROSE 2-3 GM-% IV SOLR
INTRAVENOUS | Status: AC
  Filled 2012-09-15: qty 50

## 2012-09-15 SURGICAL SUPPLY — 26 items
ATTRACTOMAT 16X20 MAGNETIC DRP (DRAPES) ×2 IMPLANT
BAG ZIPLOCK 12X15 (MISCELLANEOUS) ×2 IMPLANT
BLADE SURG 15 STRL LF DISP TIS (BLADE) ×2 IMPLANT
BLADE SURG 15 STRL SS (BLADE) ×2
CANNULA VESSEL W/WING WO/VALVE (CANNULA) ×2 IMPLANT
CLOTH BEACON ORANGE TIMEOUT ST (SAFETY) ×2 IMPLANT
GAUZE SPONGE 4X4 16PLY XRAY LF (GAUZE/BANDAGES/DRESSINGS) ×2 IMPLANT
GLOVE SURG ORTHO 8.0 STRL STRW (GLOVE) ×2 IMPLANT
GLOVE SURG SS PI 6.5 STRL IVOR (GLOVE) ×2 IMPLANT
GOWN STRL NON-REIN LRG LVL3 (GOWN DISPOSABLE) ×2 IMPLANT
GOWN STRL REIN 3XL LVL4 (GOWN DISPOSABLE) ×2 IMPLANT
KIT BASIN OR (CUSTOM PROCEDURE TRAY) ×2 IMPLANT
NS IRRIG 1000ML POUR BTL (IV SOLUTION) ×2 IMPLANT
PACK EENT SPLIT (PACKS) ×2 IMPLANT
PACKING VAGINAL (PACKING) ×2 IMPLANT
PAD EYE OVAL STERILE LF (GAUZE/BANDAGES/DRESSINGS) IMPLANT
SPONGE GAUZE 4X4 12PLY (GAUZE/BANDAGES/DRESSINGS) IMPLANT
SUCTION FRAZIER 12FR DISP (SUCTIONS) ×2 IMPLANT
SUT CHROMIC 3 0 PS 2 (SUTURE) ×6 IMPLANT
SUT CHROMIC 4 0 P 3 18 (SUTURE) ×2 IMPLANT
SYR 50ML LL SCALE MARK (SYRINGE) ×2 IMPLANT
TOWEL OR 17X26 10 PK STRL BLUE (TOWEL DISPOSABLE) ×2 IMPLANT
TUBING CONNECTING 10 (TUBING) ×2 IMPLANT
VESSEL CANN W0 1 W VA 30003 (MISCELLANEOUS) ×2 IMPLANT
WATER STERILE IRR 1500ML POUR (IV SOLUTION) ×2 IMPLANT
YANKAUER SUCT BULB TIP NO VENT (SUCTIONS) ×2 IMPLANT

## 2012-09-15 NOTE — Anesthesia Postprocedure Evaluation (Signed)
Anesthesia Post Note  Patient: Anna Lawson  Procedure(s) Performed: Procedure(s) (LRB): MULTIPLE EXTRACTION WITH ALVEOLOPLASTY/PRE PROSTESTIC (N/A)  Anesthesia type: General  Patient location: PACU  Post pain: Pain level controlled  Post assessment: Patient's Cardiovascular Status Stable  Last Vitals:  Filed Vitals:   09/15/12 1215  BP: 144/80  Pulse: 83  Temp:   Resp: 15    Post vital signs: Reviewed and stable  Level of consciousness: alert  Complications: No apparent anesthesia complications

## 2012-09-15 NOTE — Progress Notes (Signed)
PRE-OPERATIVE NOTE:  09/15/2012 Anna Lawson [id number]  VITALS: BP 132/70  Pulse 89  Temp(Src) 97.5 F (36.4 C) (Oral)  Resp 18  SpO2 96%  Lab Results  Component Value Date   WBC 6.6 09/11/2012   HGB 15.1* 09/11/2012   HCT 44.6 09/11/2012   MCV 88.8 09/11/2012   PLT 246 09/11/2012   BMET    Component Value Date/Time   NA 138 09/11/2012 1040   K 3.9 09/11/2012 1040   CL 100 09/11/2012 1040   CO2 30 09/11/2012 1040   GLUCOSE 88 09/11/2012 1040   BUN 13 09/11/2012 1040   CREATININE 0.76 09/11/2012 1040   CALCIUM 9.1 09/11/2012 1040   GFRNONAA 89* 09/11/2012 1040   GFRAA >90 09/11/2012 1040    No results found for this basename: INR, PROTIME   No results found for this basename: PTT     Anna Lawson presents for multiple dental extractions with alveoloplasty and pre-prosthetic surgery as indicated in the operating room with general anesthesia.  Patient understands the significant risks and complications of the anticipated dental procedures in the operating room due to her severe aortic stenosis and cardiovascular compromise.  SUBJECTIVE: The patient denies any acute medical or dental changes and agrees to proceed with treatment as planned.    EXAM: No sign of acute dental changes.   ASSESSMENT: Patient is affected by chronic apical periodontitis, chronic periodontitis, retained root segments, dental phobia, and multiple loose teeth.  PLAN: Patient agrees to proceed with treatment as planned in the operating room as previously discussed and accepts the risks, benefits, complications of the proposed treatment.   Charlynne Pander, DDS

## 2012-09-15 NOTE — H&P (Signed)
09/15/2012  Patient:            Anna Lawson Date of Birth:  1951/07/06 MRN:                [id number]  Patient denies any acute medical or dental changes. Please use progress note from Dr. Kathlee Nations Trigt dated 09/04/2012 63/01/2013 as the history and physical for the dental operating room procedures.(see below).  Dr. Kristin Bruins  PCP is Cala Bradford, MD Referring Provider is Quintella Reichert, MD    Chief Complaint   Patient presents with   .  Shortness of Breath       eval and treat...ECHO, CATH   .  Aortic Stenosis        HPI: 62 year old short obese female former smoker with progressive and significant shortness of breath with exertion. Known history of aortic stenosis. 2-D echo last year showing moderate aortic stenosis. 2-D echo last month shows severe aortic stenosis. Cardiac catheterization shows normal coronaries. Valve not crossed with catheter. Echo demonstrates probable bicuspid valve with a calculated area of 0.7 there is moderate aortic insufficiency. Left anterior outflow tract measures 20 mm. There is no significant mitral valve disease. LV function is normal by echo with EF of 64%.   The patient was scheduled for cervical spine decompression and fusion last month but this was canceled 1 there were significant concerns over general anesthesia with her aortic stenosis. Her cardiologist is now recommended she undergo aortic valve replacement prior to cervical spine surgery. The patient's ascending aorta on 2-D echo appears to be dilated and a CTA of the thoracic aorta is pending. The patient has not had a dental exam in over 2 years and a dental clinic appointment will be arranged prior to elective valve replacement surgery. Patient has had no previous major surgery and has not had diabetes or other chronic illnesses. We would plan on a mechanical aVR do to her young age and ability to tolerate Coumadin.      Past Medical History   Diagnosis  Date   .  Spinal stenosis     .   Hyperlipidemia     .  Heart murmur     .  Aortic stenosis         bicuspid AV, mid to mod AR, mod AS by 09/17/11 echo [facility])   .  Shortness of breath         all time   .  Ankle edema         takes HCTZ   .  Dementia         a little   .  Constipation     .  History of blood transfusion     .  Pre-operative cardiovascular examination     .  Asthma           Past Surgical History   Procedure  Date   .  Cardiac catheterization         03/26/05 [facility]): normal coronaries, normal LV function, mild AS/AI.  No MR/MS.  Normal PA pressures and hemodynamics.         Family History   Problem  Relation  Age of Onset   .  Emphysema  Maternal Grandmother         never smoked   .  Emphysema  Paternal Uncle         never smoked   .  Heart disease  Mother  AGE.Marland KitchenMarland KitchenIN HER 50'S   .  Diabetes  Mother         TYPE 1   .  Bladder Cancer  Father     .  Diabetes  Brother         TYPE I     no family history of aortic stenosis or heart valve surgery   Social History History   Substance Use Topics   .  Smoking status:  Former Smoker -- 1.0 packs/day for 35 years       Types:  Cigarettes       Quit date:  07/29/2002   .  Smokeless tobacco:  Never Used   .  Alcohol Use:  No         Current Outpatient Prescriptions   Medication  Sig  Dispense  Refill   .  aspirin 81 MG tablet  Take 81 mg by mouth daily.         Marland Kitchen  donepezil (ARICEPT) 5 MG tablet  Take 5 mg by mouth daily.          .  hydrochlorothiazide (HYDRODIURIL) 25 MG tablet  Take 25 mg by mouth daily.         Marland Kitchen  omeprazole (PRILOSEC OTC) 20 MG tablet  Take 20 mg by mouth daily.          .  polyethylene glycol (MIRALAX / GLYCOLAX) packet  Take 17 g by mouth daily as needed. For constipation         .  traMADol (ULTRAM) 50 MG tablet  Take 50 mg by mouth every 6 (six) hours as needed. For pain             No current facility-administered medications for this visit.       Facility-Administered  Medications Ordered in Other Visits   Medication  Dose  Route  Frequency  Provider  Last Rate  Last Dose   .  lactated ringers infusion       Continuous PRN  Tyrone Nine, CRNA               Allergies   Allergen  Reactions   .  Adhesive (Tape)         blisters   .  Codeine  Nausea And Vomiting        Review of Systems General the patient is gradually decreased in size from 5 feet 4 foot 9 over the past 3 years She denies any active dental problems but has not seen a dentist in 2 years Thoracic review is negative for trauma recent upper respiratory infection the patient was evaluated prior to planned cervical spine surgery by Dr. Sherene Sires and apparently had PFTs which I cannot find in the record. Fluoroscopy upper hemidiaphragms were normal Cardiac review is positive for history of a murmur since 2006 no history of rheumatic heart disease as a child no history of arrhythmia or coronaries are clean GI is negative for hepatitis jaundice blood per Endocrine negative for diabetes Vascular negative for DVT claudication or TIA Neurologic negative her stroke or seizure   BP 136/82  Pulse 80  Resp 16  Ht 4\' 9"  (1.448 m)  Wt 216 lb (97.977 kg)  BMI 46.74 kg/m2  SpO2 98% Physical Exam   Gen. short obese Caucasian female no acute distress accompanied by family HEENT normocephalic pupils equal dentition adequate but several missing teeth Neck without JVD mass or bruit Lymphatics no palpable nodes in the neck Thorax diminished  breath sounds bilaterally no wheezing Cardiac 3/6 systolic ejection murmur regular rhythm Abdomen obese soft without pulsatile mass Extremities no clubbing cyanosis or edema or tenderness Vascular pulses intact in the extremities Neurologic no focal motor deficit     Diagnostic Tests: 2-D echo from [facility] cardiology and coronary arteriogram is reviewed. Evidence of severe aortic stenosis mild aortic insufficiency her LV function no significant CAD potential  dilatation of the ascending aorta with a bicuspid valve   Impression: Proceed with evaluation including finding the previously performed PFTs due to her high-risk for poorly complications after sternotomy for her short obese stature, dental evaluation, and CTA to it daily potential location of the ascending aorta   Plan: Return in one to 2 weeks after above studies

## 2012-09-15 NOTE — Progress Notes (Signed)
Pt up in room and ambulated to bathroom with standby assist.

## 2012-09-15 NOTE — Op Note (Signed)
Patient:            Anna Lawson Date of Birth:  [birth year] MRN:                [id number]   DATE OF PROCEDURE:  09/15/2012               OPERATIVE REPORT   PREOPERATIVE DIAGNOSES: 1. Severe aortic stenosis 2. Pre-heart valve surgery dental protocol 3. Chronic apical periodontitis 4. Retained root segment area #9 5. Chronic periodontitis 6. Bilateral mandibular lingual tori  POSTOPERATIVE DIAGNOSES: 1. Severe aortic stenosis 2. Pre-heart valve surgery dental protocol 3. Chronic apical periodontitis 4. Retained root segment area #9 5. Chronic periodontitis 6. Bilateral mandibular lingual tori  OPERATIONS: 1. Multiple extraction of tooth numbers 9, 20, 21, 22, 23, 24, 25, 26, 27, 28, 29, and 30. 2. 3 Quadrants of alveoloplasty 3. Bilateral mandibular tori reductions   SURGEON: Charlynne Pander, DDS  ASSISTANT: Zettie Pho, (dental assistant)  ANESTHESIA: General anesthesia via oral endotracheal tube.  MEDICATIONS: 1. Ancef 2 g IV prior to invasive dental procedures. 2. Local anesthesia with a total utilization of 1 carpule each containing 34 mg of lidocaine with 0.017 mg of epinephrine as well as 4 carpules each containing 9 mg of bupivacaine with 0.009 mg of epinephrine.  SPECIMENS: There are 12 teeth that were discarded.  DRAINS: None  CULTURES: None  COMPLICATIONS: None   ESTIMATED BLOOD LOSS: 50 mLs.  INTRAVENOUS FLUIDS: 700 mLs of Lactated ringers solution.  INDICATIONS: The patient was recently diagnosed with severe aortic stenosis.  A dental consultation was then requested as part of a medically necessary pre-heart valve surgery dental protocol evaluation.  The patient was examined and treatment planned for extraction of remaining teeth with alveoloplasty and pre-prosthetic surgery as indicated.  This treatment plan was formulated to decrease the risks and complications associated with dental infection from affecting the patient's systemic  health and the anticipated aortic valve replacement heart surgery.  OPERATIVE FINDINGS: Patient was examined operating room number 2.  The teeth were identified for extraction. The patient was noted be affected by chronic periodontitis, chronic apical periodontitis, presence of retained root segment area #9, and bilateral mandibular lingual tori.   DESCRIPTION OF PROCEDURE: Patient was brought to the main operating room number 2. Patient was then placed in the supine position on the operating table. General Anesthesia was then induced per the anesthesia team. The patient was then prepped and draped in the usual manner for dental medicine procedure. A timeout was performed. The patient was identified and procedures were verified. A throat pack was placed at this time. The oral cavity was then thoroughly examined with the findings noted above. The patient was then ready for dental medicine procedure as follows:  Local anesthesia was then administered sequentially with a total utilization of 1 carpule each containing 34 mg of lidocaine with 0.017 mg of epinephrine as well as 4 carpules  each containing 9 mg bupivacaine with 0.009 mg of epinephrine.  The Maxillary left and right anterior quadrants first approached. Anesthesia was then delivered utilizing infiltration with lidocaine with epinephrine. A #15 blade incision was then made from the distal of #11 and extended to the mesial #8.  A  surgical flap was then carefully reflected to expose the retained roots in the area of #9 on the palatal aspect. Appropriate amounts of buccal and interseptal bone were then removed utilizing a surgical handpiece and bur and copious amounts of sterile saline.  The  retained root segment #9 was then removed with a rongeur. Alveoloplasty was then performed utilizing a ronguers and bone file. Significant granulation tissues were removed with a curet at this time. The surgical site was then irrigated with copious amounts of  sterile saline. The tissues were approximated and trimmed appropriately. The surgical site was then closed from the distal of #11 and extended the mesial #8 utilizing 4-0 chromic gut material in a continuous interrupted suture technique x1.  At this point time, the mandibular quadrants were approached. The patient was given bilateral inferior alveolar nerve blocks and long buccal nerve blocks utilizing the bupivacaine with epinephrine. Further infiltration was then achieved utilizing the bupivacaine with epinephrine. A 15 blade incision was then made from the distal of number 19 and extended to the distal of #31.  A surgical flap was then carefully reflected. Appropriate amounts of buccal and interseptal bone were then removed appropriately with a surgical handpiece and bur and copious amounts of sterile water. The teeth were then subluxated with a series of straight elevators. Tooth numbers 20, 21, 22, 23, 24, 25, 26, 27, 28, 29 were then removed with a 151 forceps without complications. Tooth #30 was then removed with a 17 forceps without complications.  At this point time the flaps were further reflected on the lingual aspect to expose the bilateral mandibular tori. The tori were then reduced utilizing a surgical handpiece and bur and copious of sterile water. Alveoloplasty was then performed utilizing a rongeurs and bone file. The tissues were approximated and trimmed appropriately. The surgical sites were then irrigated with copious amounts of sterile saline x4. The mandibular left surgical site was then closed from the distal of #19 and extended the mesial numbers 24 utilizing 3-0 chromic gut suture in a continuous interrupted suture technique x1. The mandibular right surgical site was then closed from the distal of #31 and extended the mesial numbers 25 utilizing 3-0 chromic gut suture in a continuous interrupted suture technique x1. 4 separate interrupted sutures were then placed utilizing 3-0 chromic gut  material to close the surgical site as indicated.  At this point time, the entire mouth was irrigated with copious amounts of sterile saline. The patient was exam for complications, seeing none, the dental medicine procedure was deemed to be complete. The throat pack was removed at this time. A series of 4 x 4 gauze were placed in the mouth to aid hemostasis. The patient was then handed over to the anesthesia team for final disposition. After an appropriate amount of time, the patient was extubated and taken to the postanesthsia care unit with stable vital signs and a good condition. All counts were correct for the dental medicine procedure.  The patient may proceed with aortic valve replacement at the discretion of Dr. Donata Clay barring any post-operative complication.     Charlynne Pander, DDS.

## 2012-09-15 NOTE — Progress Notes (Signed)
Pt was noted to have stitching in gums where teeth were extracted.  No bleeding noted.  Pt states she is numb in her lower gums but does have some throbbing in upper area.  She rates her pain to be 4/10 and is requesting tramadol prior to going home.

## 2012-09-15 NOTE — Anesthesia Preprocedure Evaluation (Signed)
Anesthesia Evaluation  Patient identified by MRN, date of birth, ID band Patient awake    Reviewed: Allergy & Precautions, H&P , NPO status , Patient's Chart, lab work & pertinent test results, reviewed documented beta blocker date and time   Airway Mallampati: II TM Distance: >3 FB Neck ROM: limited    Dental   Pulmonary shortness of breath and with exertion, asthma ,  breath sounds clear to auscultation        Cardiovascular + Valvular Problems/Murmurs AS and AI Rhythm:regular + Systolic murmurs    Neuro/Psych PSYCHIATRIC DISORDERS negative neurological ROS  negative psych ROS   GI/Hepatic negative GI ROS, Neg liver ROS,   Endo/Other  negative endocrine ROSMorbid obesity  Renal/GU negative Renal ROS  negative genitourinary   Musculoskeletal   Abdominal   Peds  Hematology negative hematology ROS (+)   Anesthesia Other Findings See surgeon's H&P   Reproductive/Obstetrics negative OB ROS                           Anesthesia Physical Anesthesia Plan  ASA: III  Anesthesia Plan: General   Post-op Pain Management:    Induction: Intravenous  Airway Management Planned: Video Laryngoscope Planned and Oral ETT  Additional Equipment:   Intra-op Plan:   Post-operative Plan: Extubation in OR  Informed Consent: I have reviewed the patients History and Physical, chart, labs and discussed the procedure including the risks, benefits and alternatives for the proposed anesthesia with the patient or authorized representative who has indicated his/her understanding and acceptance.   Dental Advisory Given  Plan Discussed with: CRNA and Surgeon  Anesthesia Plan Comments:         Anesthesia Quick Evaluation

## 2012-09-15 NOTE — Progress Notes (Signed)
Pt is noted to have a swollen bottom lip but per Verlon Au RN the size in swelling is unchanged.

## 2012-09-15 NOTE — Progress Notes (Signed)
PACU note----- spoke to Dr. Gelene Mink and pt may have arterial line d/c'ed and go to short stay when ready; a line right wrist d/c'ed and pressure right wrist held x 5 mins

## 2012-09-15 NOTE — Transfer of Care (Signed)
Immediate Anesthesia Transfer of Care Note  Patient: Anna Lawson  Procedure(s) Performed: Procedure(s) (LRB): MULTIPLE EXTRACTION WITH ALVEOLOPLASTY/PRE PROSTESTIC (N/A)  Patient Location: PACU  Anesthesia Type: General  Level of Consciousness: sedated, patient cooperative and responds to stimulaton  Airway & Oxygen Therapy: Patient Spontanous Breathing and Patient connected to face mask oxgen  Post-op Assessment: Report given to PACU RN and Post -op Vital signs reviewed and stable  Post vital signs: Reviewed and stable  Complications: No apparent anesthesia complications

## 2012-09-16 ENCOUNTER — Encounter (HOSPITAL_COMMUNITY): Payer: Self-pay | Admitting: Dentistry

## 2012-09-18 ENCOUNTER — Encounter: Payer: Self-pay | Admitting: Cardiothoracic Surgery

## 2012-09-18 ENCOUNTER — Ambulatory Visit (INDEPENDENT_AMBULATORY_CARE_PROVIDER_SITE_OTHER): Payer: Federal, State, Local not specified - PPO | Admitting: Cardiothoracic Surgery

## 2012-09-18 ENCOUNTER — Other Ambulatory Visit: Payer: Self-pay | Admitting: *Deleted

## 2012-09-18 VITALS — BP 153/85 | HR 83 | Resp 16 | Ht <= 58 in | Wt 216.0 lb

## 2012-09-18 DIAGNOSIS — I359 Nonrheumatic aortic valve disorder, unspecified: Secondary | ICD-10-CM

## 2012-09-18 DIAGNOSIS — I712 Thoracic aortic aneurysm, without rupture: Secondary | ICD-10-CM

## 2012-09-18 DIAGNOSIS — I350 Nonrheumatic aortic (valve) stenosis: Secondary | ICD-10-CM

## 2012-09-18 DIAGNOSIS — R918 Other nonspecific abnormal finding of lung field: Secondary | ICD-10-CM

## 2012-09-18 NOTE — Progress Notes (Signed)
PCP is Cala Bradford, MD Referring Provider is Cala Bradford, MD Hisstory and Physical Chief Complaint  Patient presents with  . Aortic Stenosis    Further discuss surgery, S/P CTA Chest 09/14/12 and dental surgery    HPI: 62 year old obese white female reformed smoker with aortic stenosis, possibly bicuspid valve. Class III symptoms of dyspnea and chest tightness are worsening. Cardiac catheterization demonstrated no significant coronary disease. Aortic valve area by echo is 0.8. No other significant valvular heart disease. No history of rheumatic heart disease as a child. No history of arrhythmias syncope or symptoms of orthopnea or PND.  The patient was prepared for cervical spine surgery when a loud murmur was determined by her anesthesiologist and surgery was canceled. This led to the above cardiology workup by Dr. [name]. Aortic valve replacement has been recommended for the patient's aortic stenosis based on her aortic valve area estimated by echo and her class III symptoms.  The patient was evaluated by the dental service and underwent dental extraction before elective aortic valve replacement. She has had no problems with her mouth since the extractions 3 days ago.  The patient has been evaluated by pulmonary and by her report PFTs performed at the lower office were okay. CT scan of the chest shows no significant fusiform aneurysm of the ascending aorta which measures 4.2 cm. There are no suspicious pulmonary nodules on lung windows.  Past Medical History  Diagnosis Date  . Spinal stenosis   . Hyperlipidemia   . Aortic stenosis     bicuspid AV, mid to mod AR, mod AS by 09/17/11 echo [facility])  . Shortness of breath     all time  . Ankle edema     takes HCTZ, both ankles  . Constipation   . History of blood transfusion 40 yrs ago  . Pre-operative cardiovascular examination   . Dementia     a little  . Asthma   . Heart murmur     Past Surgical History   Procedure Laterality Date  . Cardiac catheterization  08-21-2012    03/26/05 [facility]): normal coronaries, normal LV function, mild AS/AI.  No MR/MS.  Normal PA pressures and hemodynamics.  . Multiple extractions with alveoloplasty N/A 09/15/2012    Procedure: MULTIPLE EXTRACTION WITH ALVEOLOPLASTY/PRE PROSTESTIC;  Surgeon: Charlynne Pander, DDS;  Location: [facility] ORS;  Service: Oral Surgery;  Laterality: N/A;  Extraction of tooth #9, [phone] with alveoloplasty and bilateral mandibular tori reductions    Family History  Problem Relation Age of Onset  . Emphysema Maternal Grandmother     never smoked  . Emphysema Paternal Uncle     never smoked  . Heart disease Mother     AGE.Marland KitchenMarland KitchenIN HER 50'S  . Diabetes Mother     TYPE 1  . Bladder Cancer Father   . Diabetes Brother     TYPE I    Social History History  Substance Use Topics  . Smoking status: Former Smoker -- 1.00 packs/day for 35 years    Types: Cigarettes    Quit date: 07/29/2002  . Smokeless tobacco: Never Used  . Alcohol Use: Yes     Comment: occasional    Current Outpatient Prescriptions  Medication Sig Dispense Refill  . aspirin 81 MG tablet Take 81 mg by mouth daily after breakfast.       . donepezil (ARICEPT) 5 MG tablet Take 5 mg by mouth daily after breakfast.       . hydrochlorothiazide (HYDRODIURIL) 25  MG tablet Take 25 mg by mouth daily after breakfast.       . ibuprofen (ADVIL,MOTRIN) 200 MG tablet Take 200 mg by mouth every 6 (six) hours as needed for pain.      Marland Kitchen omeprazole (PRILOSEC OTC) 20 MG tablet Take 20 mg by mouth every morning.       . polyethylene glycol (MIRALAX / GLYCOLAX) packet Take 17 g by mouth daily as needed. For constipation      . traMADol (ULTRAM) 50 MG tablet Take 50 mg by mouth every 6 (six) hours as needed. For pain      . traMADol (ULTRAM) 50 MG tablet Take 1 tablet (50 mg total) by mouth every 6 (six) hours as needed for pain.  40 tablet  0   No current  facility-administered medications for this visit.    Allergies  Allergen Reactions  . Adhesive (Tape)     blisters  . Codeine Nausea And Vomiting    Review of Systems The patient has chronic bilateral upper arm and neck pain from cervical spinal stenosis. She's not able to drive. The patient has had no previous thoracic surgery pneumothorax or rib fractures or trauma. The patient has had no recent symptoms of upper respiratory infection. The patient is no history of GI blood loss or any other known contraindication to long-term Coumadin use. The patient denies diabetes. The patient has not had preoperative product off for studies.  BP 153/85  Pulse 83  Resp 16  Ht 4\' 9"  (1.448 m)  Wt 216 lb (97.977 kg)  BMI 46.73 kg/m2  SpO2 96% Physical Exam Alert and comfortable accompanied by family HEENT edentulous pupils equal neck without JVD or bruit Neck without adenopathy mass Thorax obese clear breath sounds no deformity Cardiac 2/6 systolic ejection murmur Abdomen obese soft without pulsatile mass Extremities nontender palpable pulses no calf tenderness Neuro normal gait no focal motor deficit  Diagnostic Tests: 2-D echo, coronary tear grams reviewed CT of chest reviewed  Impression: Severe aortic stenosis normal coronary disease preserved LV function  Plan: Aortic valve with a mechanical valve-probable Sorin TOPHAT On February 27. At [facility] hospital. The indications risks and alternatives reviewed with patient and she agrees.

## 2012-09-21 ENCOUNTER — Encounter (HOSPITAL_COMMUNITY): Payer: Self-pay | Admitting: Pharmacy Technician

## 2012-09-22 ENCOUNTER — Ambulatory Visit (HOSPITAL_COMMUNITY)
Admission: RE | Admit: 2012-09-22 | Discharge: 2012-09-22 | Disposition: A | Discharge status: Home / Self Care | Payer: Federal, State, Local not specified - PPO | Source: Ambulatory Visit | Attending: Cardiothoracic Surgery | Admitting: Cardiothoracic Surgery

## 2012-09-22 DIAGNOSIS — I359 Nonrheumatic aortic valve disorder, unspecified: Secondary | ICD-10-CM

## 2012-09-22 DIAGNOSIS — Z01810 Encounter for preprocedural cardiovascular examination: Secondary | ICD-10-CM

## 2012-09-22 NOTE — Progress Notes (Signed)
Pre-op Cardiac Surgery  Carotid Findings:  Bilateral:  No evidence of hemodynamically significant internal carotid artery stenosis.   Vertebral artery flow is antegrade.      Upper Extremity Right Left  Brachial Pressures 152 149  Radial Waveforms Bi Bi  Ulnar Waveforms Tri Tri  Palmar Arch (Allen's Test) Obliterate with radial compression. Normal with ulnar compression. Decrease >50% with radial compression. Normal with ulnar compression.   Farrel Demark, RDMS, RVT 09/22/2012

## 2012-09-23 ENCOUNTER — Encounter (HOSPITAL_COMMUNITY)
Admission: RE | Admit: 2012-09-23 | Discharge: 2012-09-23 | Disposition: A | Discharge status: Home / Self Care | Payer: Federal, State, Local not specified - PPO | Source: Ambulatory Visit | Attending: Cardiothoracic Surgery | Admitting: Cardiothoracic Surgery

## 2012-09-23 ENCOUNTER — Encounter (HOSPITAL_COMMUNITY): Payer: Self-pay | Admitting: Dentistry

## 2012-09-23 ENCOUNTER — Encounter (HOSPITAL_COMMUNITY): Payer: Self-pay

## 2012-09-23 ENCOUNTER — Telehealth: Payer: Self-pay | Admitting: *Deleted

## 2012-09-23 ENCOUNTER — Ambulatory Visit (HOSPITAL_COMMUNITY): Payer: Federal, State, Local not specified - PPO | Admitting: Dentistry

## 2012-09-23 VITALS — BP 141/85 | HR 88 | Temp 97.9°F

## 2012-09-23 VITALS — BP 135/84 | HR 79 | Temp 97.9°F | Resp 20 | Ht <= 58 in | Wt 208.8 lb

## 2012-09-23 DIAGNOSIS — I359 Nonrheumatic aortic valve disorder, unspecified: Secondary | ICD-10-CM

## 2012-09-23 DIAGNOSIS — Z0189 Encounter for other specified special examinations: Secondary | ICD-10-CM

## 2012-09-23 DIAGNOSIS — K08409 Partial loss of teeth, unspecified cause, unspecified class: Secondary | ICD-10-CM

## 2012-09-23 DIAGNOSIS — K08109 Complete loss of teeth, unspecified cause, unspecified class: Secondary | ICD-10-CM

## 2012-09-23 HISTORY — DX: Gastro-esophageal reflux disease without esophagitis: K21.9

## 2012-09-23 LAB — BLOOD GAS, ARTERIAL
Acid-Base Excess: 4.1 mmol/L — ABNORMAL HIGH (ref 0.0–2.0)
Bicarbonate: 28 mEq/L — ABNORMAL HIGH (ref 20.0–24.0)
FIO2: 0.21 %
O2 Saturation: 97.1 %
Patient temperature: 98.6
TCO2: 29.2 mmol/L (ref 0–100)
pCO2 arterial: 40.6 mmHg (ref 35.0–45.0)
pH, Arterial: 7.453 — ABNORMAL HIGH (ref 7.350–7.450)
pO2, Arterial: 83.5 mmHg (ref 80.0–100.0)

## 2012-09-23 LAB — CBC
HCT: 45.6 % (ref 36.0–46.0)
Hemoglobin: 16.3 g/dL — ABNORMAL HIGH (ref 12.0–15.0)
MCH: 30.8 pg (ref 26.0–34.0)
MCHC: 35.7 g/dL (ref 30.0–36.0)
MCV: 86 fL (ref 78.0–100.0)
Platelets: 276 10*3/uL (ref 150–400)
RBC: 5.3 MIL/uL — ABNORMAL HIGH (ref 3.87–5.11)
RDW: 12.9 % (ref 11.5–15.5)
WBC: 10.8 10*3/uL — ABNORMAL HIGH (ref 4.0–10.5)

## 2012-09-23 LAB — URINALYSIS, ROUTINE W REFLEX MICROSCOPIC
Glucose, UA: NEGATIVE mg/dL
Hgb urine dipstick: NEGATIVE
Ketones, ur: NEGATIVE mg/dL
Nitrite: NEGATIVE
Protein, ur: NEGATIVE mg/dL
Specific Gravity, Urine: 1.022 (ref 1.005–1.030)
Urobilinogen, UA: 1 mg/dL (ref 0.0–1.0)
pH: 5.5 (ref 5.0–8.0)

## 2012-09-23 LAB — URINE MICROSCOPIC-ADD ON

## 2012-09-23 LAB — COMPREHENSIVE METABOLIC PANEL
ALT: 16 U/L (ref 0–35)
AST: 20 U/L (ref 0–37)
Albumin: 3.8 g/dL (ref 3.5–5.2)
Alkaline Phosphatase: 95 U/L (ref 39–117)
BUN: 12 mg/dL (ref 6–23)
CO2: 25 mEq/L (ref 19–32)
Calcium: 9.8 mg/dL (ref 8.4–10.5)
Chloride: 97 mEq/L (ref 96–112)
Creatinine, Ser: 0.73 mg/dL (ref 0.50–1.10)
GFR calc Af Amer: >90 mL/min (ref >90)
GFR calc non Af Amer: 90 mL/min — ABNORMAL LOW (ref >90)
Glucose, Bld: 107 mg/dL — ABNORMAL HIGH (ref 70–99)
Potassium: 3.7 mEq/L (ref 3.5–5.1)
Sodium: 136 mEq/L (ref 135–145)
Total Bilirubin: 1.1 mg/dL (ref 0.3–1.2)
Total Protein: 7.7 g/dL (ref 6.0–8.3)

## 2012-09-23 LAB — PROTIME-INR
INR: 0.97 (ref 0.00–1.49)
Prothrombin Time: 12.8 seconds (ref 11.6–15.2)

## 2012-09-23 LAB — APTT: aPTT: 37 seconds (ref 24–37)

## 2012-09-23 LAB — ABO/RH: ABO/RH(D): B NEG

## 2012-09-23 LAB — SURGICAL PCR SCREEN: MRSA, PCR: NEGATIVE

## 2012-09-23 LAB — TYPE AND SCREEN
ABO/RH(D): B NEG
Antibody Screen: NEGATIVE

## 2012-09-23 LAB — HEMOGLOBIN A1C
Hgb A1c MFr Bld: 5.6 % (ref <5.7)
Mean Plasma Glucose: 114 mg/dL (ref <117)

## 2012-09-23 MED ORDER — DEXMEDETOMIDINE HCL IN NACL 400 MCG/100ML IV SOLN
0.1000 ug/kg/h | INTRAVENOUS | Status: AC
  Administered 2012-09-24: 0.3 ug/kg/h via INTRAVENOUS
  Filled 2012-09-23: qty 100

## 2012-09-23 MED ORDER — DEXTROSE 5 % IV SOLN
750.0000 mg | INTRAVENOUS | Status: DC
  Filled 2012-09-23: qty 750

## 2012-09-23 MED ORDER — NITROGLYCERIN IN D5W 200-5 MCG/ML-% IV SOLN
2.0000 ug/min | INTRAVENOUS | Status: DC
  Filled 2012-09-23: qty 250

## 2012-09-23 MED ORDER — DEXTROSE 5 % IV SOLN
1.5000 g | INTRAVENOUS | Status: AC
  Administered 2012-09-24: 1.5 g via INTRAVENOUS
  Filled 2012-09-23 (×2): qty 1.5

## 2012-09-23 MED ORDER — MAGNESIUM SULFATE 50 % IJ SOLN
40.0000 meq | INTRAMUSCULAR | Status: DC
  Filled 2012-09-23: qty 10

## 2012-09-23 MED ORDER — SODIUM CHLORIDE 0.9 % IV SOLN
INTRAVENOUS | Status: AC
  Administered 2012-09-24: 70 mL/h via INTRAVENOUS
  Filled 2012-09-23: qty 40

## 2012-09-23 MED ORDER — PHENYLEPHRINE HCL 10 MG/ML IJ SOLN
30.0000 ug/min | INTRAVENOUS | Status: DC
  Filled 2012-09-23: qty 2

## 2012-09-23 MED ORDER — VERAPAMIL HCL 2.5 MG/ML IV SOLN
INTRAVENOUS | Status: DC
  Filled 2012-09-23: qty 2.5

## 2012-09-23 MED ORDER — VANCOMYCIN HCL 10 G IV SOLR
1500.0000 mg | INTRAVENOUS | Status: AC
  Administered 2012-09-24: 1500 mg via INTRAVENOUS
  Filled 2012-09-23: qty 1500

## 2012-09-23 MED ORDER — SODIUM CHLORIDE 0.9 % IV SOLN
INTRAVENOUS | Status: AC
  Administered 2012-09-24: 1 [IU]/h via INTRAVENOUS
  Filled 2012-09-23: qty 1

## 2012-09-23 MED ORDER — METOPROLOL TARTRATE 12.5 MG HALF TABLET: 12.5000 mg | ORAL_TABLET | Freq: Once | ORAL | Status: DC

## 2012-09-23 MED ORDER — POTASSIUM CHLORIDE 2 MEQ/ML IV SOLN
80.0000 meq | INTRAVENOUS | Status: DC
  Filled 2012-09-23: qty 40

## 2012-09-23 MED ORDER — EPINEPHRINE HCL 1 MG/ML IJ SOLN
0.5000 ug/min | INTRAVENOUS | Status: DC
  Filled 2012-09-23: qty 4

## 2012-09-23 MED ORDER — DOPAMINE-DEXTROSE 3.2-5 MG/ML-% IV SOLN
2.0000 ug/kg/min | INTRAVENOUS | Status: DC
  Filled 2012-09-23: qty 250

## 2012-09-23 NOTE — Progress Notes (Signed)
req'd sleep study from [facility] done 13

## 2012-09-23 NOTE — Progress Notes (Signed)
POST OPERATIVE NOTE:  09/23/2012 Anna Lawson [id number]  VITALS: BP 141/85  Pulse 88  Temp(Src) 97.9 F (36.6 C) (Oral)  Patient is status post multiple extractions with alveoloplasty in the operating room on 09/15/2012.   SUBJECTIVE: Patient with minimal complaints. Patient indicates that she has sutures remaining. Patient indicates that she is to have aortic valve replacement tomorrow Dr. Donata Clay.  EXAM: No sign of infection, heme, or ooze. Sutures are loosely intact. Generalized primary closure is noted. Extraoral and intraoral bruising is noted. Procedure: 30 second chlorhexidine rinse. Sutures removed without complications.  ASSESSMENT: Post operative course is consistent with dental procedures performed int he operating room. Patient is cleared for aortic valve replacement with Dr. Donata Clay.  PLAN: 1. Continue salt water rinses every 2 hours as needed to aid healing. 2. Brush tongue daily. 3. Return to clinic in 1 month for evaluation for start of upper and lower complete dentures.   Charlynne Pander, DDS

## 2012-09-23 NOTE — Pre-Procedure Instructions (Addendum)
MARIS ABASCAL  09/23/2012    Your procedure is scheduled on:  09/24/12  Report to [facility] at600 AM.  Call this number if you have problems the morning of surgery: [phone]   Remember:   Do not eat food or drink liquids after midnight.   Take these medicines the morning of surgery with A SIP OF WATER:aricept, omeprazole,tramadol   Do not wear jewelry, make-up or nail polish.  Do not wear lotions, powders, or perfumes. You may wear deodorant.  Do not shave 48 hours prior to surgery. Men may shave face and neck.  Do not bring valuables to the hospital.  Contacts, dentures or bridgework may not be worn into surgery.  Leave suitcase in the car. After surgery it may be brought to your room.  For patients admitted to the hospital, checkout time is 11:00 AM the day of  discharge.   Patients discharged the day of surgery will not be allowed to drive  home.  Name and phone number of your driver\  Special Instructions: Incentive Spirometry - Practice and bring it with you on the day of surgery. Shower using CHG 2 nights before surgery and the night before surgery.  If you shower the day of surgery use CHG.  Use special wash - you have one bottle of CHG for all showers.  You should use approximately 1/3 of the bottle for each shower.   Please read over the following fact sheets that you were given: Pain Booklet, Coughing and Deep Breathing, Blood Transfusion Information, Open Heart Packet, MRSA Information and Surgical Site Infection Prevention

## 2012-09-23 NOTE — Patient Instructions (Signed)
PLAN: 1. Continue salt water rinses every 2 hours as needed to aid healing. 2. Brush tongue daily. 3. Return to clinic in 1 month for evaluation for start of upper and lower complete dentures.   Dr. Kristin Bruins

## 2012-09-23 NOTE — Telephone Encounter (Signed)
Completed heart surgery education with patient and her husband in short stay.  Book given.  Answered all questions.  Told to call me if any other concerns come up.

## 2012-09-24 ENCOUNTER — Inpatient Hospital Stay (HOSPITAL_COMMUNITY)
Admission: RE | Admit: 2012-09-24 | Discharge: 2012-09-25 | DRG: 136 | Disposition: A | Discharge status: Home / Self Care | Payer: Federal, State, Local not specified - PPO | Source: Ambulatory Visit | Attending: Cardiothoracic Surgery | Admitting: Cardiothoracic Surgery

## 2012-09-24 ENCOUNTER — Observation Stay (HOSPITAL_COMMUNITY): Payer: Federal, State, Local not specified - PPO

## 2012-09-24 ENCOUNTER — Inpatient Hospital Stay (HOSPITAL_COMMUNITY): Payer: Federal, State, Local not specified - PPO | Admitting: Anesthesiology

## 2012-09-24 ENCOUNTER — Encounter: Payer: Self-pay | Admitting: Vascular Surgery

## 2012-09-24 ENCOUNTER — Encounter (HOSPITAL_COMMUNITY): Payer: Self-pay | Admitting: Anesthesiology

## 2012-09-24 ENCOUNTER — Encounter (HOSPITAL_COMMUNITY)
Admission: RE | Disposition: A | Discharge status: Home / Self Care | Payer: Self-pay | Source: Ambulatory Visit | Attending: Cardiothoracic Surgery

## 2012-09-24 DIAGNOSIS — J45909 Unspecified asthma, uncomplicated: Secondary | ICD-10-CM | POA: Diagnosis present

## 2012-09-24 DIAGNOSIS — Z87891 Personal history of nicotine dependence: Secondary | ICD-10-CM

## 2012-09-24 DIAGNOSIS — I359 Nonrheumatic aortic valve disorder, unspecified: Secondary | ICD-10-CM

## 2012-09-24 DIAGNOSIS — M47812 Spondylosis without myelopathy or radiculopathy, cervical region: Secondary | ICD-10-CM | POA: Diagnosis present

## 2012-09-24 DIAGNOSIS — M1990 Unspecified osteoarthritis, unspecified site: Secondary | ICD-10-CM | POA: Diagnosis present

## 2012-09-24 DIAGNOSIS — F0390 Unspecified dementia without behavioral disturbance: Secondary | ICD-10-CM | POA: Diagnosis present

## 2012-09-24 DIAGNOSIS — K219 Gastro-esophageal reflux disease without esophagitis: Secondary | ICD-10-CM | POA: Diagnosis present

## 2012-09-24 DIAGNOSIS — E785 Hyperlipidemia, unspecified: Secondary | ICD-10-CM | POA: Diagnosis present

## 2012-09-24 DIAGNOSIS — Z538 Procedure and treatment not carried out for other reasons: Secondary | ICD-10-CM

## 2012-09-24 DIAGNOSIS — Z79899 Other long term (current) drug therapy: Secondary | ICD-10-CM

## 2012-09-24 HISTORY — PX: INTRAOPERATIVE TRANSESOPHAGEAL ECHOCARDIOGRAM: SHX5062

## 2012-09-24 LAB — CBC
HCT: 36.4 % (ref 36.0–46.0)
Hemoglobin: 12.6 g/dL (ref 12.0–15.0)
MCH: 30 pg (ref 26.0–34.0)
MCHC: 34.6 g/dL (ref 30.0–36.0)
MCV: 86.7 fL (ref 78.0–100.0)
Platelets: 210 10*3/uL (ref 150–400)
RBC: 4.2 MIL/uL (ref 3.87–5.11)
RDW: 13.2 % (ref 11.5–15.5)
WBC: 9.2 10*3/uL (ref 4.0–10.5)

## 2012-09-24 LAB — POCT I-STAT 4, (NA,K, GLUC, HGB,HCT)
Glucose, Bld: 117 mg/dL — ABNORMAL HIGH (ref 70–99)
HCT: 42 % (ref 36.0–46.0)
Hemoglobin: 14.3 g/dL (ref 12.0–15.0)
Potassium: 3.2 mEq/L — ABNORMAL LOW (ref 3.5–5.1)
Sodium: 137 mEq/L (ref 135–145)

## 2012-09-24 LAB — POCT I-STAT 3, ART BLOOD GAS (G3+)
Acid-Base Excess: 5 mmol/L — ABNORMAL HIGH (ref 0.0–2.0)
Bicarbonate: 26.8 mEq/L — ABNORMAL HIGH (ref 20.0–24.0)
O2 Saturation: 100 %
Patient temperature: 36.4
TCO2: 28 mmol/L (ref 0–100)
pCO2 arterial: 28.6 mmHg — ABNORMAL LOW (ref 35.0–45.0)
pH, Arterial: 7.579 — ABNORMAL HIGH (ref 7.350–7.450)
pO2, Arterial: 192 mmHg — ABNORMAL HIGH (ref 80.0–100.0)

## 2012-09-24 LAB — POCT I-STAT, CHEM 8
BUN: 6 mg/dL (ref 6–23)
Calcium, Ion: 1.16 mmol/L (ref 1.13–1.30)
Chloride: 105 mEq/L (ref 96–112)
Creatinine, Ser: 0.7 mg/dL (ref 0.50–1.10)
Glucose, Bld: 100 mg/dL — ABNORMAL HIGH (ref 70–99)
HCT: 36 % (ref 36.0–46.0)
Hemoglobin: 12.2 g/dL (ref 12.0–15.0)
Potassium: 3.2 mEq/L — ABNORMAL LOW (ref 3.5–5.1)
Sodium: 140 mEq/L (ref 135–145)
TCO2: 26 mmol/L (ref 0–100)

## 2012-09-24 LAB — POCT I-STAT GLUCOSE
Glucose, Bld: 109 mg/dL — ABNORMAL HIGH (ref 70–99)
Operator id: 3406

## 2012-09-24 LAB — CREATININE, SERUM
Creatinine, Ser: 0.59 mg/dL (ref 0.50–1.10)
GFR calc Af Amer: >90 mL/min (ref >90)
GFR calc non Af Amer: >90 mL/min (ref >90)

## 2012-09-24 SURGERY — ECHOCARDIOGRAM, TRANSESOPHAGEAL, INTRAOPERATIVE
Anesthesia: General | Site: Chest

## 2012-09-24 MED ORDER — DONEPEZIL HCL 5 MG PO TABS
5.0000 mg | ORAL_TABLET | Freq: Every day | ORAL | Status: DC
  Administered 2012-09-24 – 2012-09-25 (×2): 5 mg via ORAL
  Filled 2012-09-24 (×3): qty 1

## 2012-09-24 MED ORDER — MIDAZOLAM HCL 5 MG/5ML IJ SOLN
INTRAMUSCULAR | Status: DC | PRN
  Administered 2012-09-24 (×2): 2 mg via INTRAVENOUS
  Administered 2012-09-24 (×2): 3 mg via INTRAVENOUS

## 2012-09-24 MED ORDER — PHENYLEPHRINE HCL 10 MG/ML IJ SOLN
0.0000 ug/min | INTRAVENOUS | Status: DC
  Filled 2012-09-24: qty 2

## 2012-09-24 MED ORDER — SODIUM CHLORIDE 0.9 % IJ SOLN
3.0000 mL | Freq: Two times a day (BID) | INTRAMUSCULAR | Status: DC
  Administered 2012-09-24 – 2012-09-25 (×3): 3 mL via INTRAVENOUS

## 2012-09-24 MED ORDER — POTASSIUM CHLORIDE 10 MEQ/100ML IV SOLN
10.0000 meq | Freq: Once | INTRAVENOUS | Status: AC
  Administered 2012-09-24: 10 meq via INTRAVENOUS
  Filled 2012-09-24: qty 100

## 2012-09-24 MED ORDER — ACETAMINOPHEN 650 MG RE SUPP: 650.0000 mg | Freq: Four times a day (QID) | RECTAL | Status: DC | PRN

## 2012-09-24 MED ORDER — LACTATED RINGERS IV SOLN
INTRAVENOUS | Status: DC | PRN
  Administered 2012-09-24 (×2): via INTRAVENOUS

## 2012-09-24 MED ORDER — LIDOCAINE HCL (CARDIAC) 20 MG/ML IV SOLN
INTRAVENOUS | Status: DC | PRN
  Administered 2012-09-24: 50 mg via INTRAVENOUS

## 2012-09-24 MED ORDER — SODIUM CHLORIDE 0.9 % IJ SOLN: OROMUCOSAL | Status: DC | PRN

## 2012-09-24 MED ORDER — LACTATED RINGERS IV SOLN
INTRAVENOUS | Status: DC
  Administered 2012-09-24: 15:00:00 via INTRAVENOUS

## 2012-09-24 MED ORDER — HEMOSTATIC AGENTS (NO CHARGE) OPTIME: TOPICAL | Status: DC | PRN

## 2012-09-24 MED ORDER — LACTATED RINGERS IV SOLN
INTRAVENOUS | Status: DC | PRN
  Administered 2012-09-24: 08:00:00 via INTRAVENOUS

## 2012-09-24 MED ORDER — ASPIRIN EC 81 MG PO TBEC
81.0000 mg | DELAYED_RELEASE_TABLET | Freq: Every day | ORAL | Status: DC
  Filled 2012-09-24: qty 1

## 2012-09-24 MED ORDER — HYDROCODONE-ACETAMINOPHEN 5-325 MG PO TABS: 1.0000 | ORAL_TABLET | ORAL | Status: DC | PRN

## 2012-09-24 MED ORDER — ALBUTEROL SULFATE (5 MG/ML) 0.5% IN NEBU: 2.5000 mg | INHALATION_SOLUTION | RESPIRATORY_TRACT | Status: DC | PRN

## 2012-09-24 MED ORDER — ARTIFICIAL TEARS OP OINT
TOPICAL_OINTMENT | OPHTHALMIC | Status: DC | PRN
  Administered 2012-09-24: 1 via OPHTHALMIC

## 2012-09-24 MED ORDER — SODIUM CHLORIDE 0.45 % IV SOLN
INTRAVENOUS | Status: DC
  Administered 2012-09-24: 15:00:00 via INTRAVENOUS

## 2012-09-24 MED ORDER — EPHEDRINE SULFATE 50 MG/ML IJ SOLN
INTRAMUSCULAR | Status: DC | PRN
  Administered 2012-09-24: 10 mg via INTRAVENOUS

## 2012-09-24 MED ORDER — ROCURONIUM BROMIDE 100 MG/10ML IV SOLN
INTRAVENOUS | Status: DC | PRN
  Administered 2012-09-24: 100 mg via INTRAVENOUS

## 2012-09-24 MED ORDER — ASPIRIN 81 MG PO CHEW
81.0000 mg | CHEWABLE_TABLET | Freq: Every day | ORAL | Status: DC
  Administered 2012-09-25: 81 mg via ORAL
  Filled 2012-09-24 (×2): qty 1

## 2012-09-24 MED ORDER — OMEPRAZOLE MAGNESIUM 20 MG PO TBEC: 20.0000 mg | DELAYED_RELEASE_TABLET | Freq: Every day | ORAL | Status: DC

## 2012-09-24 MED ORDER — ACETAMINOPHEN 325 MG PO TABS: 650.0000 mg | ORAL_TABLET | Freq: Four times a day (QID) | ORAL | Status: DC | PRN

## 2012-09-24 MED ORDER — KCL IN DEXTROSE-NACL 10-5-0.45 MEQ/L-%-% IV SOLN
INTRAVENOUS | Status: DC
  Administered 2012-09-24: 18:00:00 via INTRAVENOUS
  Filled 2012-09-24 (×2): qty 1000

## 2012-09-24 MED ORDER — PROPOFOL 10 MG/ML IV BOLUS
INTRAVENOUS | Status: DC | PRN
  Administered 2012-09-24: 50 mg via INTRAVENOUS

## 2012-09-24 MED ORDER — ENOXAPARIN SODIUM 40 MG/0.4ML ~~LOC~~ SOLN
40.0000 mg | SUBCUTANEOUS | Status: DC
  Administered 2012-09-24: 40 mg via SUBCUTANEOUS
  Filled 2012-09-24 (×2): qty 0.4

## 2012-09-24 MED ORDER — FENTANYL CITRATE 0.05 MG/ML IJ SOLN
INTRAMUSCULAR | Status: DC | PRN
  Administered 2012-09-24: 400 ug via INTRAVENOUS
  Administered 2012-09-24: 100 ug via INTRAVENOUS
  Administered 2012-09-24: 250 ug via INTRAVENOUS

## 2012-09-24 MED ORDER — SUCCINYLCHOLINE CHLORIDE 20 MG/ML IJ SOLN
INTRAMUSCULAR | Status: DC | PRN
  Administered 2012-09-24: 40 mg via INTRAVENOUS

## 2012-09-24 MED ORDER — ALBUMIN HUMAN 5 % IV SOLN
INTRAVENOUS | Status: AC
  Administered 2012-09-24: 12.5 g
  Filled 2012-09-24: qty 250

## 2012-09-24 MED ORDER — GLYCOPYRROLATE 0.2 MG/ML IJ SOLN
INTRAMUSCULAR | Status: DC | PRN
  Administered 2012-09-24: 0.2 mg via INTRAVENOUS
  Administered 2012-09-24: 0.4 mg via INTRAVENOUS

## 2012-09-24 MED ORDER — NITROGLYCERIN IN D5W 200-5 MCG/ML-% IV SOLN
INTRAVENOUS | Status: DC | PRN
  Administered 2012-09-24: 5 ug/min via INTRAVENOUS

## 2012-09-24 MED ORDER — PHENYLEPHRINE HCL 10 MG/ML IJ SOLN
0.0000 ug/min | INTRAVENOUS | Status: DC
  Filled 2012-09-24: qty 1

## 2012-09-24 MED ORDER — TRAMADOL HCL 50 MG PO TABS
50.0000 mg | ORAL_TABLET | Freq: Four times a day (QID) | ORAL | Status: DC | PRN
  Administered 2012-09-24 – 2012-09-25 (×2): 50 mg via ORAL
  Filled 2012-09-24 (×2): qty 1

## 2012-09-24 MED ORDER — PHENYLEPHRINE HCL 10 MG/ML IJ SOLN
10.0000 mg | INTRAVENOUS | Status: DC | PRN
  Administered 2012-09-24: 20 ug/min via INTRAVENOUS

## 2012-09-24 MED ORDER — PANTOPRAZOLE SODIUM 20 MG PO TBEC
20.0000 mg | DELAYED_RELEASE_TABLET | Freq: Every day | ORAL | Status: DC
  Administered 2012-09-24: 20 mg via ORAL
  Filled 2012-09-24 (×2): qty 1

## 2012-09-24 SURGICAL SUPPLY — 73 items
ADAPTER CARDIO PERF ANTE/RETRO (ADAPTER) ×4 IMPLANT
ATTRACTOMAT 16X20 MAGNETIC DRP (DRAPES) ×4 IMPLANT
BAG DECANTER FOR FLEXI CONT (MISCELLANEOUS) ×4 IMPLANT
BLADE STERNUM SYSTEM 6 (BLADE) ×4 IMPLANT
BLADE SURG 12 STRL SS (BLADE) ×4 IMPLANT
BLADE SURG 15 STRL LF DISP TIS (BLADE) ×2 IMPLANT
BLADE SURG 15 STRL SS (BLADE) ×2
BLANKET WARM CARDIAC ADLT BAIR (MISCELLANEOUS) ×4 IMPLANT
CANISTER SUCTION 2500CC (MISCELLANEOUS) ×4 IMPLANT
CANNULA GUNDRY RCSP 15FR (MISCELLANEOUS) ×8 IMPLANT
CANNULA VENOUS LOW PROF 34X46 (CANNULA) ×4 IMPLANT
CATH CPB KIT VANTRIGT (MISCELLANEOUS) ×4 IMPLANT
CATH HEART VENT LEFT (CATHETERS) ×2 IMPLANT
CATH RETROPLEGIA CORONARY 14FR (CATHETERS) IMPLANT
CATH ROBINSON RED A/P 18FR (CATHETERS) ×12 IMPLANT
CATH THORACIC 36FR RT ANG (CATHETERS) ×4 IMPLANT
CLIP FOGARTY SPRING 6M (CLIP) ×4 IMPLANT
CLOTH BEACON ORANGE TIMEOUT ST (SAFETY) ×4 IMPLANT
COVER MAYO STAND STRL (DRAPES) ×4 IMPLANT
COVER SURGICAL LIGHT HANDLE (MISCELLANEOUS) ×4 IMPLANT
CRADLE DONUT ADULT HEAD (MISCELLANEOUS) ×4 IMPLANT
DRAIN CHANNEL 32F RND 10.7 FF (WOUND CARE) ×4 IMPLANT
DRAPE CARDIOVASCULAR INCISE (DRAPES) ×2
DRAPE SLUSH MACHINE 52X66 (DRAPES) IMPLANT
DRAPE SLUSH/WARMER DISC (DRAPES) ×4 IMPLANT
DRAPE SRG 135X102X78XABS (DRAPES) ×2 IMPLANT
DRSG COVADERM 4X14 (GAUZE/BANDAGES/DRESSINGS) ×4 IMPLANT
ELECT BLADE 4.0 EZ CLEAN MEGAD (MISCELLANEOUS) ×4
ELECT BLADE 6.5 EXT (BLADE) ×4 IMPLANT
ELECT CAUTERY BLADE 6.4 (BLADE) ×4 IMPLANT
ELECT REM PT RETURN 9FT ADLT (ELECTROSURGICAL) ×8
ELECTRODE BLDE 4.0 EZ CLN MEGD (MISCELLANEOUS) ×2 IMPLANT
ELECTRODE REM PT RTRN 9FT ADLT (ELECTROSURGICAL) ×4 IMPLANT
GLOVE BIO SURGEON STRL SZ7.5 (GLOVE) ×8 IMPLANT
GOWN STRL NON-REIN LRG LVL3 (GOWN DISPOSABLE) ×24 IMPLANT
HEMOSTAT POWDER SURGIFOAM 1G (HEMOSTASIS) ×16 IMPLANT
HEMOSTAT SURGICEL 2X14 (HEMOSTASIS) ×4 IMPLANT
INSERT FOGARTY XLG (MISCELLANEOUS) IMPLANT
KIT BASIN OR (CUSTOM PROCEDURE TRAY) ×4 IMPLANT
KIT ROOM TURNOVER OR (KITS) ×4 IMPLANT
KIT SUCTION CATH 14FR (SUCTIONS) ×4 IMPLANT
LEAD PACING MYOCARDI (MISCELLANEOUS) ×4 IMPLANT
LINE VENT (MISCELLANEOUS) ×4 IMPLANT
NS IRRIG 1000ML POUR BTL (IV SOLUTION) ×24 IMPLANT
PACK OPEN HEART (CUSTOM PROCEDURE TRAY) ×4 IMPLANT
PAD ARMBOARD 7.5X6 YLW CONV (MISCELLANEOUS) ×8 IMPLANT
SET CARDIOPLEGIA MPS 5001102 (MISCELLANEOUS) ×4 IMPLANT
SPONGE GAUZE 4X4 12PLY (GAUZE/BANDAGES/DRESSINGS) ×8 IMPLANT
SURGIFLO W/THROMBIN 8M KIT (HEMOSTASIS) IMPLANT
SUT BONE WAX W31G (SUTURE) ×4 IMPLANT
SUT ETHIBON 2 0 V 52N 30 (SUTURE) ×8 IMPLANT
SUT ETHIBOND 2 0 SH (SUTURE) ×2
SUT ETHIBOND 2 0 SH 36X2 (SUTURE) ×2 IMPLANT
SUT PROLENE 3 0 RB 1 (SUTURE) ×4 IMPLANT
SUT PROLENE 3 0 SH 1 (SUTURE) IMPLANT
SUT PROLENE 3 0 SH DA (SUTURE) IMPLANT
SUT PROLENE 4 0 RB 1 (SUTURE) ×2
SUT PROLENE 4 0 SH DA (SUTURE) ×4 IMPLANT
SUT PROLENE 4-0 RB1 .5 CRCL 36 (SUTURE) ×2 IMPLANT
SUT STEEL 6MS V (SUTURE) ×8 IMPLANT
SUT STEEL SZ 6 DBL 3X14 BALL (SUTURE) ×4 IMPLANT
SUT VIC AB 1 CTX 36 (SUTURE) ×4
SUT VIC AB 1 CTX36XBRD ANBCTR (SUTURE) ×4 IMPLANT
SUT VIC AB 2-0 CTX 27 (SUTURE) IMPLANT
SUT VIC AB 3-0 X1 27 (SUTURE) IMPLANT
SYR 10ML KIT SKIN ADHESIVE (MISCELLANEOUS) IMPLANT
SYSTEM SAHARA CHEST DRAIN ATS (WOUND CARE) ×4 IMPLANT
TOWEL OR 17X24 6PK STRL BLUE (TOWEL DISPOSABLE) ×8 IMPLANT
TOWEL OR 17X26 10 PK STRL BLUE (TOWEL DISPOSABLE) ×8 IMPLANT
TRAY FOLEY IC TEMP SENS 14FR (CATHETERS) ×4 IMPLANT
UNDERPAD 30X30 INCONTINENT (UNDERPADS AND DIAPERS) ×4 IMPLANT
VENT LEFT HEART 12002 (CATHETERS) ×4
WATER STERILE IRR 1000ML POUR (IV SOLUTION) ×8 IMPLANT

## 2012-09-24 NOTE — Transfer of Care (Signed)
Immediate Anesthesia Transfer of Care Note  Patient: Anna Lawson  Procedure(s) Performed: Procedure(s): INTRAOPERATIVE TRANSESOPHAGEAL ECHOCARDIOGRAM (N/A)  Patient Location: ICU  Anesthesia Type:General  Level of Consciousness: sedated, unresponsive and Patient remains intubated per anesthesia plan  Airway & Oxygen Therapy: Patient remains intubated per anesthesia plan and Patient placed on Ventilator (see vital sign flow sheet for setting)  Post-op Assessment: Report given to PACU RN and Post -op Vital signs reviewed and stable  Post vital signs: Reviewed and stable  Complications: No apparent anesthesia complications

## 2012-09-24 NOTE — Procedures (Signed)
Extubation Procedure Note  Patient Details:   Name: Anna Lawson DOB: [birth year] MRN: [id number]   Airway Documentation:  AIRWAYS 7.5 mm (Active)  Secured at (cm) 22 cm 09/24/2012 12:00 AM    Evaluation  O2 sats: stable throughout and currently acceptable Complications: No apparent complications Patient did tolerate procedure well. Bilateral Breath Sounds: Clear   Yes Pt awake and alert. Extubated per protocol, placed on  3L [place], sat 100%. Positive cuff leak, NIF -20, VC 900, IS 1600. Pt able to vocalize.  Arloa Koh 09/24/2012, 2:51 PM

## 2012-09-24 NOTE — Progress Notes (Signed)
TCTS  The patient was examined and preop studies reviewed. There has been no change from the prior exam and the patient is ready for surgery.  Plan AVR on T [place] today for AS

## 2012-09-24 NOTE — Progress Notes (Signed)
Dr. Maren Beach notified of unable to get SBP above 80 consistently.  Orders received to give 250cc albumin and increase LR to 50cc/hr.  If  SBP does not go above 90 consistently, then restart on Neosynephrine and titrate to keep SBP > 90.

## 2012-09-24 NOTE — Anesthesia Postprocedure Evaluation (Signed)
  Anesthesia Post-op Note  Patient: Anna Lawson  Procedure(s) Performed: Procedure(s): INTRAOPERATIVE TRANSESOPHAGEAL ECHOCARDIOGRAM (N/A)  Patient Location: SICU  Anesthesia Type:General  Level of Consciousness: sedated and Patient remains intubated per anesthesia plan  Airway and Oxygen Therapy: Patient remains intubated per anesthesia plan and Patient placed on Ventilator (see vital sign flow sheet for setting)  Post-op Pain: none  Post-op Assessment: Post-op Vital signs reviewed, Patient's Cardiovascular Status Stable, Respiratory Function Stable, Patent Airway, No signs of Nausea or vomiting and Pain level controlled  Post-op Vital Signs: stable  Complications: No apparent anesthesia complications

## 2012-09-24 NOTE — Progress Notes (Signed)
  Echocardiogram Echocardiogram Transesophageal has been performed.  Anna Lawson A 09/24/2012, 10:13 AM

## 2012-09-24 NOTE — Progress Notes (Signed)
TCTS  Introperative TEE by anesthesia as well as by cardiology- Dr Sherlie Ban indicates mild AS with peak velocity 1.2 so no incision or sugery was performed. Situation d/w Dr Kym Groom who will set up for later outpt cath to cross valve and measure gradient. Discussed with family in waiting room.

## 2012-09-24 NOTE — Preoperative (Signed)
Beta Blockers   Reason not to administer Beta Blockers:Hold beta blocker due to hypotension 

## 2012-09-24 NOTE — Progress Notes (Signed)
1930-Pt complaining of "11" out of 10 pain in L hand.  KCL running through L hand PIV.  KCL stopped, PIV flushed, and warm pack placed on hand.  Pt says pain was better and 3/10 currently.  Will monitor pt.

## 2012-09-24 NOTE — Anesthesia Procedure Notes (Addendum)
  Narrative:    Procedure Name: Intubation Date/Time: 09/24/2012 9:08 AM Performed by: Leona Singleton A Pre-anesthesia Checklist: Patient identified Patient Re-evaluated:Patient Re-evaluated prior to inductionOxygen Delivery Method: Circle system utilized Preoxygenation: Pre-oxygenation with 100% oxygen Intubation Type: IV induction Ventilation: Mask ventilation without difficulty Laryngoscope Size: Miller and 2 Grade View: Grade I Tube type: Oral Tube size: 7.5 mm Number of attempts: 1 Airway Equipment and Method: Stylet Placement Confirmation: ETT inserted through vocal cords under direct vision,  positive ETCO2,  CO2 detector and breath sounds checked- equal and bilateral Secured at: 22 cm Tube secured with: Tape Dental Injury: Teeth and Oropharynx as per pre-operative assessment

## 2012-09-24 NOTE — Anesthesia Preprocedure Evaluation (Addendum)
Anesthesia Evaluation  Patient identified by MRN, date of birth, ID band Patient awake    Reviewed: Allergy & Precautions, H&P , NPO status , Patient's Chart, lab work & pertinent test results  Airway Mallampati: I TM Distance: >3 FB Neck ROM: full    Dental   Pulmonary shortness of breath and with exertion, asthma , former smoker,          Cardiovascular Exercise Tolerance: Poor + DOE + Valvular Problems/Murmurs AS Rhythm:regular Rate:Normal  CT scan of the chest shows no significant fusiform aneurysm of the ascending aorta which measures 4.2 cm  severe aortic stenosis. Cardiac catheterization shows normal coronaries. Valve not crossed with catheter. Echo demonstrates probable bicuspid valve with a calculated area of 0.7 there is moderate aortic insufficiency. Left anterior outflow tract measures 20 mm. There is no significant mitral valve disease. LV function is normal by echo with EF of 64%.    Neuro/Psych PSYCHIATRIC DISORDERS dementia    GI/Hepatic Neg liver ROS, GERD-  Medicated and Controlled,  Endo/Other  Morbid obesity  Renal/GU negative Renal ROS     Musculoskeletal  (+) Arthritis -, Osteoarthritis,    Abdominal   Peds negative pediatric ROS (+)  Hematology negative hematology ROS (+)   Anesthesia Other Findings   Reproductive/Obstetrics                       Anesthesia Physical Anesthesia Plan  ASA: III  Anesthesia Plan: General   Post-op Pain Management:    Induction: Intravenous  Airway Management Planned: Oral ETT  Additional Equipment: Arterial line, CVP, PA Cath and TEE  Intra-op Plan:   Post-operative Plan: Post-operative intubation/ventilation  Informed Consent: I have reviewed the patients History and Physical, chart, labs and discussed the procedure including the risks, benefits and alternatives for the proposed anesthesia with the patient or authorized representative  who has indicated his/her understanding and acceptance.     Plan Discussed with: CRNA, Anesthesiologist and Surgeon  Anesthesia Plan Comments:         Anesthesia Quick Evaluation

## 2012-09-25 LAB — BASIC METABOLIC PANEL
BUN: 6 mg/dL (ref 6–23)
CO2: 27 mEq/L (ref 19–32)
Calcium: 8.3 mg/dL — ABNORMAL LOW (ref 8.4–10.5)
Chloride: 106 mEq/L (ref 96–112)
Creatinine, Ser: 0.63 mg/dL (ref 0.50–1.10)
GFR calc Af Amer: >90 mL/min (ref >90)
GFR calc non Af Amer: >90 mL/min (ref >90)
Glucose, Bld: 112 mg/dL — ABNORMAL HIGH (ref 70–99)
Potassium: 3.1 mEq/L — ABNORMAL LOW (ref 3.5–5.1)
Sodium: 140 mEq/L (ref 135–145)

## 2012-09-25 LAB — CBC
HCT: 35.6 % — ABNORMAL LOW (ref 36.0–46.0)
Hemoglobin: 12.2 g/dL (ref 12.0–15.0)
MCH: 30 pg (ref 26.0–34.0)
MCHC: 34.3 g/dL (ref 30.0–36.0)
MCV: 87.5 fL (ref 78.0–100.0)
Platelets: 207 10*3/uL (ref 150–400)
RBC: 4.07 MIL/uL (ref 3.87–5.11)
RDW: 13.2 % (ref 11.5–15.5)
WBC: 7.4 10*3/uL (ref 4.0–10.5)

## 2012-09-25 MED ORDER — POTASSIUM CHLORIDE 10 MEQ/50ML IV SOLN
10.0000 meq | INTRAVENOUS | Status: DC | PRN
  Administered 2012-09-25: 10 meq via INTRAVENOUS

## 2012-09-25 MED ORDER — POTASSIUM CHLORIDE 10 MEQ/50ML IV SOLN
INTRAVENOUS | Status: AC
  Administered 2012-09-25: 10 meq via INTRAVENOUS
  Filled 2012-09-25: qty 150

## 2012-09-25 MED FILL — Cefuroxime Sodium For Inj 750 MG: INTRAMUSCULAR | Qty: 750 | Status: AC

## 2012-09-25 MED FILL — Magnesium Sulfate Inj 50%: INTRAMUSCULAR | Qty: 10 | Status: AC

## 2012-09-25 MED FILL — Potassium Chloride Inj 2 mEq/ML: INTRAVENOUS | Qty: 40 | Status: AC

## 2012-09-25 NOTE — Progress Notes (Signed)
Discharged to home with family office visits in place teaching done  

## 2012-09-25 NOTE — Discharge Summary (Signed)
[address]            [place] [address]          [phone]         Discharge Summary  Name: Anna Lawson DOB: 01/15/1951 62 y.o. MRN: [id number]   Admission Date: 09/24/2012 Discharge Date:     Admitting Diagnosis: Severe aortic stenosis   Discharge Diagnosis:  Mild aortic stenosis  Past Medical History  Diagnosis Date  . Spinal stenosis   . Hyperlipidemia   . Aortic stenosis     bicuspid AV, mid to mod AR, mod AS by 09/17/11 echo [facility])  . Shortness of breath     all time  . Ankle edema     takes HCTZ, both ankles  . Constipation   . History of blood transfusion 40 yrs ago  . Pre-operative cardiovascular examination   . Dementia     a little  . Heart murmur   . Asthma     denies  . GERD (gastroesophageal reflux disease)     Procedures: INTRAOPERATIVE TRANSESOPHAGEAL ECHOCARDIOGRAM on 09/24/2012   HPI:  The patient is a 62 y.o. female with a known history of aortic stenosis, followed by Dr. Armanda Magic. She was scheduled for cervical spine decompression and fusion recently, but there was concern about general anesthesia with her aortic stenosis, and her surgery was cancelled.  She was referred back to her cardiologist for further evaluation.  Cardiac catheterization was performed showing normal coronaries.  The catheter did not cross the valve.  2D echocardiogram in January 2014 showed severe aortic stenosis with a probable bicuspid valve and calculated valve area of 0.7 with moderate aortic insufficiency.  A cardiac surgery consult was requested for consideration of aortic valve replacement.  The patient was seen by Dr.  Donata Clay and her films were reviewed.  A CT angiogram was performed, which showed no aneurysm of the ascending aorta.  Surgical aortic valve replacement was recommended. All risks, benefits and alternatives of surgery were explained in detail, and the patient agreed to proceed. She underwent  dental extractions by Dr. Kristin Bruins prior to scheduling her cardiac surgery, as has recovered well from this.      Hospital Course:  The patient was admitted to [facility] on 09/24/2012. The patient was taken to the operating room and intraoperative transesophageal echocardiogram was performed.  This showed only mild aortic stenosis, with peak velocity of 1.2.  This was reviewed with Dr. Donata Clay, the anesthesiologist, Dr. Katrinka Blazing, and Dr. Shirlee Latch of cardiology, and all agreed surgery should be cancelled.  The patient was awakened and extubated, and taken to ICU for further monitoring.    She has remained stable during her hospital stay and is medically stable for discharge.   She will need outpatient followup, including right heart catheterization to evaluate the valve and measure gradient.      Recent vital signs:  Filed Vitals:   09/25/12 0800  BP: 126/68  Pulse: 76  Temp:   Resp: 16    Recent laboratory studies:  CBC: Recent Labs  09/24/12 1657 09/24/12 1823 09/25/12 0400  WBC 9.2  --  7.4  HGB 12.6 12.2 12.2  HCT 36.4 36.0 35.6*  PLT 210  --  207   BMET:  Recent Labs  09/23/12 1413  09/24/12 1823 09/25/12 0400  NA 136  < > 140 140  K 3.7  < > 3.2* 3.1*  CL 97  --  105 106  CO2 25  --   --  27  GLUCOSE 107*  < > 100* 112*  BUN 12  --  6 6  CREATININE 0.73  < > 0.70 0.63  CALCIUM 9.8  --   --  8.3*  < > = values in this interval not displayed.  PT/INR:  Recent Labs  09/23/12 1413  LABPROT 12.8  INR 0.97     Discharge Medications:     Medication List    TAKE these medications       aspirin 81 MG tablet  Take 81 mg by mouth daily after breakfast.     donepezil 5 MG tablet  Commonly known as:  ARICEPT  Take 5 mg by mouth daily after breakfast.     hydrochlorothiazide 25 MG tablet  Commonly known as:  HYDRODIURIL  Take 25 mg by mouth daily after breakfast.     ibuprofen 200 MG tablet  Commonly known as:  ADVIL,MOTRIN  Take 600 mg by mouth every 6  (six) hours as needed for pain.     omeprazole 20 MG tablet  Commonly known as:  PRILOSEC OTC  Take 20 mg by mouth every morning.     polyethylene glycol packet  Commonly known as:  MIRALAX / GLYCOLAX  Take 17 g by mouth daily as needed. For constipation     traMADol 50 MG tablet  Commonly known as:  ULTRAM  Take 50 mg by mouth every 6 (six) hours as needed. For pain         Follow Up:       Future Appointments Provider Department Dept Phone   10/27/2012 11:00 AM Charlynne Pander, DDS [facility]  [facility] [phone]      Follow-up Information   Follow up with Quintella Reichert, MD. (As directed)    Contact information:   [address] [place] [address] [phone]        Adella Hare 09/25/2012, 9:34 AM

## 2012-09-25 NOTE — Progress Notes (Signed)
1 Day Post-Op Procedure(s) (LRB): INTRAOPERATIVE TRANSESOPHAGEAL ECHOCARDIOGRAM (N/A) Subjective: Feels well other than neck pain from DJD NSR   Objective: Vital signs in last 24 hours: Temp:  [94.5 F (34.7 C)-98.1 F (36.7 C)] 97.6 F (36.4 C) (02/28 0734) Pulse Rate:  [54-93] 70 (02/28 0700) Cardiac Rhythm:  [-] Normal sinus rhythm (02/28 0800) Resp:  [12-23] 14 (02/28 0700) BP: (69-110)/(39-64) 108/54 mmHg (02/28 0700) SpO2:  [91 %-100 %] 95 % (02/28 0700) Arterial Line BP: (76-143)/(43-76) 120/57 mmHg (02/28 0700) FiO2 (%):  [40 %-50.6 %] 40.4 % (02/27 1445)  Hemodynamic parameters for last 24 hours: PAP: (25-40)/(15-20) 40/20 mmHg  Intake/Output from previous day: 02/27 0701 - 02/28 0700 In: 4507.6 [P.O.:360; I.V.:3697.6; IV Piggyback:450] Out: 1745 [Urine:1745] Intake/Output this shift:    Lungs clear 1/6 systolic murmur  Lab Results:  Recent Labs  09/24/12 1657 09/24/12 1823 09/25/12 0400  WBC 9.2  --  7.4  HGB 12.6 12.2 12.2  HCT 36.4 36.0 35.6*  PLT 210  --  207   BMET:  Recent Labs  09/23/12 1413  09/24/12 1823 09/25/12 0400  NA 136  < > 140 140  K 3.7  < > 3.2* 3.1*  CL 97  --  105 106  CO2 25  --   --  27  GLUCOSE 107*  < > 100* 112*  BUN 12  --  6 6  CREATININE 0.73  < > 0.70 0.63  CALCIUM 9.8  --   --  8.3*  < > = values in this interval not displayed.  PT/INR:  Recent Labs  09/23/12 1413  LABPROT 12.8  INR 0.97   ABG    Component Value Date/Time   PHART 7.579* 09/24/2012 1440   HCO3 26.8* 09/24/2012 1440   TCO2 26 09/24/2012 1823   O2SAT 100.0 09/24/2012 1440   CBG (last 3)  No results found for this basename: GLUCAP,  in the last 72 hours  Assessment/Plan: S/P Procedure(s) (LRB): INTRAOPERATIVE TRANSESOPHAGEAL ECHOCARDIOGRAM (N/A) Plan for transfer to step-down: see transfer orders   LOS: 1 day    VAN TRIGT III,PETER 09/25/2012

## 2012-09-25 NOTE — Progress Notes (Signed)
Pt transferred to 2028, ambulated with pt on monitor to room, pt tolerated well, receiving rn [name] notified of pt's arrival, placed pt on unit's telemetry, NS verified with central monitoring that pt is on the monitor

## 2012-09-28 ENCOUNTER — Encounter (HOSPITAL_COMMUNITY): Payer: Self-pay | Admitting: Cardiothoracic Surgery

## 2012-09-28 ENCOUNTER — Other Ambulatory Visit: Payer: Self-pay | Admitting: Cardiology

## 2012-09-28 MED FILL — Sodium Chloride IV Soln 0.9%: INTRAVENOUS | Qty: 1000 | Status: AC

## 2012-09-28 MED FILL — Heparin Sodium (Porcine) Inj 1000 Unit/ML: INTRAMUSCULAR | Qty: 10 | Status: AC

## 2012-09-28 MED FILL — Sodium Chloride Irrigation Soln 0.9%: Qty: 3000 | Status: AC

## 2012-09-28 MED FILL — Heparin Sodium (Porcine) Inj 1000 Unit/ML: INTRAMUSCULAR | Qty: 30 | Status: AC

## 2012-09-28 MED FILL — Electrolyte-R (PH 7.4) Solution: INTRAVENOUS | Qty: 3000 | Status: AC

## 2012-09-29 ENCOUNTER — Inpatient Hospital Stay (HOSPITAL_BASED_OUTPATIENT_CLINIC_OR_DEPARTMENT_OTHER)
Admission: RE | Admit: 2012-09-29 | Discharge: 2012-09-29 | Disposition: A | Discharge status: Home / Self Care | Payer: Federal, State, Local not specified - PPO | Source: Ambulatory Visit | Attending: Cardiology | Admitting: Cardiology

## 2012-09-29 ENCOUNTER — Encounter (HOSPITAL_BASED_OUTPATIENT_CLINIC_OR_DEPARTMENT_OTHER)
Admission: RE | Disposition: A | Discharge status: Home / Self Care | Payer: Self-pay | Source: Ambulatory Visit | Attending: Cardiology

## 2012-09-29 DIAGNOSIS — I359 Nonrheumatic aortic valve disorder, unspecified: Secondary | ICD-10-CM | POA: Insufficient documentation

## 2012-09-29 DIAGNOSIS — I350 Nonrheumatic aortic (valve) stenosis: Secondary | ICD-10-CM

## 2012-09-29 DIAGNOSIS — R0600 Dyspnea, unspecified: Secondary | ICD-10-CM

## 2012-09-29 LAB — POCT I-STAT 3, ART BLOOD GAS (G3+)
O2 Saturation: 93 %
TCO2: 26 mmol/L (ref 0–100)
pCO2 arterial: 40.9 mmHg (ref 35.0–45.0)

## 2012-09-29 SURGERY — JV LEFT HEART CATHETERIZATION WITH CORONARY ANGIOGRAM

## 2012-09-29 MED ORDER — ACETAMINOPHEN 325 MG PO TABS: 650.0000 mg | ORAL_TABLET | ORAL | Status: DC | PRN

## 2012-09-29 MED ORDER — SODIUM CHLORIDE 0.9 % IV SOLN
250.0000 mL | INTRAVENOUS | Status: DC | PRN
  Administered 2012-09-29: 250 mL via INTRAVENOUS

## 2012-09-29 MED ORDER — DIAZEPAM 5 MG PO TABS: 5.0000 mg | ORAL_TABLET | ORAL | Status: DC

## 2012-09-29 MED ORDER — SODIUM CHLORIDE 0.9 % IV SOLN: 1.0000 mL/kg/h | INTRAVENOUS | Status: DC

## 2012-09-29 MED ORDER — ASPIRIN 81 MG PO CHEW: 324.0000 mg | CHEWABLE_TABLET | ORAL | Status: DC

## 2012-09-29 MED ORDER — SODIUM CHLORIDE 0.9 % IJ SOLN: 3.0000 mL | INTRAMUSCULAR | Status: DC | PRN

## 2012-09-29 MED ORDER — SODIUM CHLORIDE 0.9 % IJ SOLN: 3.0000 mL | Freq: Two times a day (BID) | INTRAMUSCULAR | Status: DC

## 2012-09-29 MED ORDER — ONDANSETRON HCL 4 MG/2ML IJ SOLN: 4.0000 mg | Freq: Four times a day (QID) | INTRAMUSCULAR | Status: DC | PRN

## 2012-09-29 MED ORDER — SODIUM CHLORIDE 0.9 % IV SOLN: INTRAVENOUS | Status: DC

## 2012-09-29 NOTE — OR Nursing (Signed)
Meal served 

## 2012-09-29 NOTE — Progress Notes (Signed)
Bedrest begins @ 1315, tegaderm dressing applied to right groin site by Theodoro Grist, site level 0.

## 2012-09-29 NOTE — CV Procedure (Signed)
PROCEDURE:  Left heart catheterization with selective coronary angiography, left ventriculogram.  INDICATIONS:    The risks, benefits, and details of the procedure were explained to the patient.  The patient verbalized understanding and wanted to proceed.  Informed written consent was obtained.  PROCEDURE TECHNIQUE:  After Xylocaine anesthesia a 55F sheath was placed in the right femoral artery with a single anterior needle wall stick.   Left coronary angiography was done using a Judkins L4 guide catheter.  Right coronary angiography was done using a Judkins R4 guide catheter.  Left ventriculography was done using a pigtail catheter.    CONTRAST:   none  COMPLICATIONS:  None.    HEMODYNAMICS:  Aortic pressure was 133/21mmHg  ; LV pressure was 149/32mmHg ; LVEDP .  There was a mean gradient between the left ventricle and aorta of 16-47mmHg.    ANGIOGRAPHIC DATA:   No angiographic data was obtained.    LEFT VENTRICULOGRAM: No LV gram done  IMPRESSIONS: 1.  Mild to moderate AS   RECOMMENDATION:   1.  D/C home after ambulating in 2 hours 2.  Followup with my NP in 3 weeks for groin check 3.  Patient is cleared from a cardiac standpoint for surgery.

## 2012-09-30 ENCOUNTER — Encounter (HOSPITAL_COMMUNITY): Payer: Self-pay | Admitting: Pharmacy Technician

## 2012-09-30 NOTE — Interval H&P Note (Deleted)
History and Physical Interval Note:  09/30/2012 1:28 AM  Anna Lawson  has presented today for surgery, with the diagnosis of cp  The various methods of treatment have been discussed with the patient and family. After consideration of risks, benefits and other options for treatment, the patient has consented to  Procedure(s): JV LEFT HEART CATHETERIZATION WITH CORONARY ANGIOGRAM (N/A) as a surgical intervention .  The patient's history has been reviewed, patient examined, no change in status, stable for surgery.  I have reviewed the patient's chart and labs.  Questions were answered to the patient's satisfaction.     TURNER,TRACI R

## 2012-09-30 NOTE — Interval H&P Note (Signed)
History and Physical Interval Note:  09/30/2012 1:30 AM  Anna Lawson  has presented today for surgery, with the diagnosis of cp  The various methods of treatment have been discussed with the patient and family. After consideration of risks, benefits and other options for treatment, the patient has consented to  Procedure(s): JV LEFT HEART CATHETERIZATION WITH CORONARY ANGIOGRAM (N/A) as a surgical intervention .  The patient's history has been reviewed, patient examined, no change in status, stable for surgery.  I have reviewed the patient's chart and labs.  Questions were answered to the patient's satisfaction.     TURNER,TRACI R

## 2012-09-30 NOTE — H&P (Addendum)
Subjective:     CC:  AS           HPI:  General:  Anna Lawson is a 62 yo female followed by Dr Mayford Knife with a hx of Aortic Stenosis. She has been having increasing SOB with less exertion such as walking in from parking deck over time associated with occasional chest pressure Her swelling is currently stable and she is taking her HCTZ daily. She rarely will have her heart race, usually this is associated with her increase activity She has been having dizziness but no syncope. She returns today post cardiac catheterization. She saw Dr Maren Beach later and was set up for AVR.  During the intial part of the surgery and TEE probe was placed that only showed mild to moderate AS and so surgery was cancelled and Dr. Morton Peters has asked for a limited left heart cath to determine AV gradient.  . Her cardiac cath without signifcant CAD. No PND, syncope no fever, chills nor GI complaints. no problems with groin cath site..        ROS:  as noted in HPI, tingling in hands and fingers, needs cervical disc surgery which was canceled due to severity of Aortic stenosis, no GI complaints nor neurological changes.       Medical History: Asthma, aortic stenosis/regurg with bicuspid aortic valve, SBE prophylaxis, ECHO 8/06 and 09, no CAD by cath 06 and 08/21/12 per [name], Edema, Hyperlipidemia, Vitamin D deficiency, normal coronary arteries by cath 2006 with mild AS/AI at that time, partial empty sella, possible meningioma on MRI 9/13, severe cervical spinal stenosis, Dr Wynetta Emery, memory loss, Dr Dohmeier , Aortic stenosis with Peak AV velocity 3.9 m/sec, mean AV gradient and AVA 0.8 cm noted 08/21/12.        Hospitalization/Major Diagnostic Procedure: childbirth 70, 75, 77, heart cath 06.        Family History: Father: deceased cancer, bladder Mother: deceased diabetes (type 1), heart attack 25s Paternal Grand Father: deceased Paternal Grand Mother: deceased Maternal Grand  Father: deceased Maternal Grand Mother: deceased Brother 1: deceased diabetes type 1 Brother2: died at 56 MVA        Social History:  General: History of smoking cigarettes: Former smoker, Quit in year 2004, Pack-year Hx: 20. Smoking: quit 2004. Alcohol: rare wine. Recreational drug use: no. 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 Marital Status: Married [name]. Children: [name], [name] [facility] to contact them). Religion: faith important, not active in church.        Medications: Prilosec OTC 20 MG Tablet Delayed Release 1 tablet as needed, Aspirin 81 MG Tablet 1 tablet Once a day, Pravachol 40 Tablet TAKE TWO TABLETS BY MOUTH ONE TIME PER DAY IN THE EVENING Not taking due to muscle spasms, Hydrochlorothiazide 25MG  Tablet TAKE ONE TABLET BY MOUTH EVERY DAY AS NEEDED FOR SWELLING , Tramadol HCl 50 MG Tablet 1 tablet as needed for pain every 6 hrs, Aricept 5 MG Tablet 1 tablet at bedtime Once a day, Medication List reviewed and reconciled with the patient       Allergies: Codeine Sulfate: vomiting: Side Effects, Adhesive Tape 1"x5yd.       Objective:     Vitals: Wt 217.2, Wt change -1.6 lb, Ht 60, BMI 42.41, Pulse sitting 76, BP sitting 122/78.       Examination:  Cardiology, General:  GENERAL APPEARANCE: pleasant, NAD. HEENT: unremarkable. CAROTID UPSTROKE: normal, no bruit. JVD: flat. HEART SOUNDS: regular, normal S1, S2, no S3 or S4. MURMUR: 2/6 systolic murmur. LUNGS: no rales or wheezes. ABDOMEN: soft, non tender, positive bowel sounds,. EXTREMITIES: no leg edema. PERIPHERAL PULSES: 2 plus bilateral.  right groin cath site well healed no swelling bruising no bruit, 2= right femoral pulse.       Assessment:  1.  Severe AS by echo but by introp TEE appears to only be mild to moderate.  Dr. Donata Clay has asked for a left heart cath to determine AV gradient before considering AVR.        Plan:     1. Aortic valve disorders  Continue Aspirin Tablet, 81 MG, 1 tablet, Orally, Once a day .    2.  Plan limited cath with left heart cath

## 2012-10-02 NOTE — Discharge Summary (Signed)
patient examined and medical record reviewed,agree with above note. VAN TRIGT III,PETER 10/02/2012   

## 2012-10-05 ENCOUNTER — Encounter (HOSPITAL_COMMUNITY)
Admission: RE | Admit: 2012-10-05 | Discharge: 2012-10-05 | Disposition: A | Discharge status: Home / Self Care | Payer: Federal, State, Local not specified - PPO | Source: Ambulatory Visit | Attending: Neurosurgery | Admitting: Neurosurgery

## 2012-10-05 ENCOUNTER — Encounter (HOSPITAL_COMMUNITY): Payer: Self-pay

## 2012-10-05 HISTORY — DX: Myoneural disorder, unspecified: G70.9

## 2012-10-05 NOTE — Pre-Procedure Instructions (Signed)
Anna Lawson  10/05/2012   Your procedure is scheduled on:  10-06-2012  Report to [facility] at 5:30 AM.  Call this number if you have problems the morning of surgery: [phone]   Remember:   Do not eat food or drink liquids after midnight.   Take these medicines the morning of surgery with A SIP OF WATER: Donepezil(Aricept),omeprazole(Prilosec),tramadol(Ultram)   Do not wear jewelry, make-up or nail polish.  Do not wear lotions, powders, or perfumes. You may wear deodorant.  Do not shave 48 hours prior to surgery.   Do not bring valuables to the hospital.  Contacts, dentures or bridgework may not be worn into surgery.  Leave suitcase in the car. After surgery it may be brought to your room.   For patients admitted to the hospital, checkout time is 11:00 AM the day of  discharge.   Patients discharged the day of surgery will not be allowed to drive home.   Special Instructions: Shower using CHG 2 nights before surgery and the night before surgery.  If you shower the day of surgery use CHG.  Use special wash - you have one bottle of CHG for all showers.  You should use approximately 1/3 of the bottle for each shower.   Please read over the following fact sheets that you were given: Pain Booklet, Coughing and Deep Breathing and Surgical Site Infection Prevention

## 2012-10-05 NOTE — Progress Notes (Signed)
Office notified that pt. Needs orders.

## 2012-10-05 NOTE — Progress Notes (Signed)
Anesthesia Chart Review:  Patient is a 62 year old female scheduled for 4 level posterior cervical fusion/foraminotomy by Dr. Wynetta Emery on 10/06/12.  This procedure was initially scheduled for 08/12/12, but was ultimately canceled due to what was felt to be symptomatic AS.  Her echo was repeated and showed progression to moderate to severe AS. Cath on 08/21/12 confirmed severe AS with normal coronaries.  She underwent pre-AVR dental surgery on 09/15/12.  She was taken to the OR for AVR on 09/24/12; however pre-operative TEE only showed mild AS with peak velocity of 1.2.  This was reviewed by surgeon Dr. Donata Clay, anesthesiologist Dr. Katrinka Blazing, and cardiologist Dr. Shirlee Latch who all agreed on findings and recommended that her surgery be cancelled.  She has since had another left heart cath on 09/29/12 to assess AS and showed mild to moderate AS.  Patient's primary cardiologist Dr. Armanda Magic then cleared patient for surgery.  Other history includes morbid obesity, former smoker, spinal stenosis, asthma, dementia, GERD, hyperlipidemia, heart murmur with mild to moderate AS as of 08/2012. PCP is Dr. Laurann Montana. She has also seen Dr. Vickey Huger at [facility] in late 2013 for possible dementia and sleep disturbance work-up .   By notes, a pre-operative TEE was done on 09/24/12 and showed mild AS with peak velocity of 1.2 (I do not see a report however.)  Her 2D echo from [place] on 08/13/12 showed EF 64.5%, AV poorly seen, sever calcification of the AV, moderate to severe AS, mild TR.   Per my note on 08/07/12, review of [facility] records then showed a nuclear stress test on 09/27/11 that showed normal myocardial perfusion, no regional wall motion abnormalities, stress EF 83%. She also had a event monitor read on 10/21/11 that showed NSR, SB during sleep as low as 46 bpm, ST as high as 136 bpm, occasional PACs and PVCs which were felt benign.   EKG on 09/11/12 showed NSR.   2V CXR on 09/23/12 showed no  acute cardiopulmonary abnormalities.  1V CXR report on 09/24/12 showed left jugular Swan-Ganz catheter tip projects over right lower lobe pulmonary artery. Enlargement of cardiac silhouette. Minimal atherosclerotic calcification aorta. Pulmonary vascularity normal. Lungs clear. No pleural effusion, pneumothorax or acute osseous findings.  Labs from 09/25/12 noted.    Sleep study done in 05/2012 ([facility]) showed hypoxemia with possible cardiac arrhythmia ("extremely variable heart rate"), but not sleep apnea.   Prior to scheduling patient for surgery, Dr. Wynetta Emery had sent her to Dr. Sherene Sires for a preoperative pulmonology evaluation. Please refer to his note from 07/06/2012. His assessment included:  ".Marland KitchenMarland KitchenShe does not have significant copd clinically but would like to get a baseline set of pft's and note her diaphragms work fine but the phrenic nerves are at risk - given her body habitus she'll need both to lie flat flat comfortably and may benefit from post op bipap transiently but I see no pulmonary contraindicatoin to surgery. Obesity also risks atx/ dvt/pe so strongly admonished min narcotics and maximal mobilization post op. In addition, max gerd rx perioperatively to reduce upper airway irritation from suspected gerd. See instructions for specific recommendations which were reviewed directly with the patient who was given a copy with highlighter outlining the key components...PFT shows minimal airflow obstruction. Ok to proceed with surgery."   Patient has recently undergo a very thorough cardiac work-up.  She has been cleared by Dr. Mayford Knife.  If no acute changes then would anticipate she could proceed as planned.  Revonda Standard  [facility] Stay [facility]/Anesthesiology Phone [phone] 10/05/2012 3:01 PM

## 2012-10-06 ENCOUNTER — Encounter (HOSPITAL_COMMUNITY): Payer: Self-pay | Admitting: Vascular Surgery

## 2012-10-06 ENCOUNTER — Inpatient Hospital Stay (HOSPITAL_COMMUNITY): Payer: Federal, State, Local not specified - PPO | Admitting: Anesthesiology

## 2012-10-06 ENCOUNTER — Encounter (HOSPITAL_COMMUNITY): Payer: Self-pay | Admitting: Surgery

## 2012-10-06 ENCOUNTER — Encounter (HOSPITAL_COMMUNITY)
Admission: RE | Disposition: A | Discharge status: Home Health | Payer: Self-pay | Source: Ambulatory Visit | Attending: Neurosurgery

## 2012-10-06 ENCOUNTER — Inpatient Hospital Stay (HOSPITAL_COMMUNITY)
Admission: RE | Admit: 2012-10-06 | Discharge: 2012-10-09 | DRG: 864 | Disposition: A | Discharge status: Home Health | Payer: Federal, State, Local not specified - PPO | Source: Ambulatory Visit | Attending: Neurosurgery | Admitting: Neurosurgery

## 2012-10-06 ENCOUNTER — Inpatient Hospital Stay (HOSPITAL_COMMUNITY): Payer: Federal, State, Local not specified - PPO

## 2012-10-06 DIAGNOSIS — Z7982 Long term (current) use of aspirin: Secondary | ICD-10-CM

## 2012-10-06 DIAGNOSIS — Z79899 Other long term (current) drug therapy: Secondary | ICD-10-CM

## 2012-10-06 DIAGNOSIS — Z87891 Personal history of nicotine dependence: Secondary | ICD-10-CM

## 2012-10-06 DIAGNOSIS — E785 Hyperlipidemia, unspecified: Secondary | ICD-10-CM | POA: Diagnosis present

## 2012-10-06 DIAGNOSIS — F0390 Unspecified dementia without behavioral disturbance: Secondary | ICD-10-CM | POA: Diagnosis present

## 2012-10-06 DIAGNOSIS — J45909 Unspecified asthma, uncomplicated: Secondary | ICD-10-CM | POA: Diagnosis present

## 2012-10-06 DIAGNOSIS — M4712 Other spondylosis with myelopathy, cervical region: Principal | ICD-10-CM | POA: Diagnosis present

## 2012-10-06 DIAGNOSIS — F411 Generalized anxiety disorder: Secondary | ICD-10-CM | POA: Diagnosis present

## 2012-10-06 DIAGNOSIS — I359 Nonrheumatic aortic valve disorder, unspecified: Secondary | ICD-10-CM | POA: Diagnosis present

## 2012-10-06 DIAGNOSIS — Z6841 Body Mass Index (BMI) 40.0 and over, adult: Secondary | ICD-10-CM

## 2012-10-06 DIAGNOSIS — K219 Gastro-esophageal reflux disease without esophagitis: Secondary | ICD-10-CM | POA: Diagnosis present

## 2012-10-06 HISTORY — PX: POSTERIOR CERVICAL FUSION/FORAMINOTOMY: SHX5038

## 2012-10-06 SURGERY — POSTERIOR CERVICAL FUSION/FORAMINOTOMY LEVEL 4
Anesthesia: General | Wound class: Clean

## 2012-10-06 MED ORDER — SODIUM CHLORIDE 0.9 % IV SOLN
INTRAVENOUS | Status: AC
  Filled 2012-10-06: qty 500

## 2012-10-06 MED ORDER — HEMOSTATIC AGENTS (NO CHARGE) OPTIME
TOPICAL | Status: DC | PRN
  Administered 2012-10-06: 1 via TOPICAL

## 2012-10-06 MED ORDER — CYCLOBENZAPRINE HCL 10 MG PO TABS
10.0000 mg | ORAL_TABLET | Freq: Three times a day (TID) | ORAL | Status: DC | PRN
  Administered 2012-10-06 – 2012-10-09 (×7): 10 mg via ORAL
  Filled 2012-10-06 (×8): qty 1

## 2012-10-06 MED ORDER — DEXAMETHASONE SODIUM PHOSPHATE 10 MG/ML IJ SOLN
INTRAMUSCULAR | Status: AC
  Filled 2012-10-06: qty 1

## 2012-10-06 MED ORDER — OMEPRAZOLE MAGNESIUM 20 MG PO TBEC: 20.0000 mg | DELAYED_RELEASE_TABLET | Freq: Two times a day (BID) | ORAL | Status: DC

## 2012-10-06 MED ORDER — LIDOCAINE HCL 4 % MT SOLN
OROMUCOSAL | Status: DC | PRN
  Administered 2012-10-06: 4 mL via TOPICAL

## 2012-10-06 MED ORDER — DEXAMETHASONE SODIUM PHOSPHATE 10 MG/ML IJ SOLN
10.0000 mg | Freq: Four times a day (QID) | INTRAMUSCULAR | Status: AC
  Administered 2012-10-06 – 2012-10-07 (×4): 10 mg via INTRAVENOUS
  Filled 2012-10-06 (×4): qty 1

## 2012-10-06 MED ORDER — DIPHENHYDRAMINE HCL 25 MG PO CAPS
25.0000 mg | ORAL_CAPSULE | Freq: Four times a day (QID) | ORAL | Status: DC | PRN
  Administered 2012-10-07 – 2012-10-08 (×2): 25 mg via ORAL
  Filled 2012-10-06 (×4): qty 1

## 2012-10-06 MED ORDER — MIDAZOLAM HCL 5 MG/5ML IJ SOLN
INTRAMUSCULAR | Status: DC | PRN
  Administered 2012-10-06: 2 mg via INTRAVENOUS

## 2012-10-06 MED ORDER — DONEPEZIL HCL 10 MG PO TABS
10.0000 mg | ORAL_TABLET | Freq: Every day | ORAL | Status: DC
  Administered 2012-10-06 – 2012-10-08 (×3): 10 mg via ORAL
  Filled 2012-10-06 (×4): qty 1

## 2012-10-06 MED ORDER — THROMBIN 5000 UNITS EX SOLR
CUTANEOUS | Status: DC | PRN
  Administered 2012-10-06: 5000 [IU] via TOPICAL

## 2012-10-06 MED ORDER — ACETAMINOPHEN 650 MG RE SUPP: 650.0000 mg | RECTAL | Status: DC | PRN

## 2012-10-06 MED ORDER — CEFAZOLIN SODIUM 1-5 GM-% IV SOLN
1.0000 g | Freq: Three times a day (TID) | INTRAVENOUS | Status: AC
  Administered 2012-10-06 – 2012-10-08 (×6): 1 g via INTRAVENOUS
  Filled 2012-10-06 (×7): qty 50

## 2012-10-06 MED ORDER — FENTANYL CITRATE 0.05 MG/ML IJ SOLN
INTRAMUSCULAR | Status: DC | PRN
  Administered 2012-10-06: 100 ug via INTRAVENOUS
  Administered 2012-10-06: 50 ug via INTRAVENOUS
  Administered 2012-10-06: 100 ug via INTRAVENOUS

## 2012-10-06 MED ORDER — ONDANSETRON HCL 4 MG/2ML IJ SOLN
4.0000 mg | Freq: Once | INTRAMUSCULAR | Status: AC | PRN
  Administered 2012-10-06: 4 mg via INTRAVENOUS

## 2012-10-06 MED ORDER — ARTIFICIAL TEARS OP OINT
TOPICAL_OINTMENT | OPHTHALMIC | Status: DC | PRN
  Administered 2012-10-06: 1 via OPHTHALMIC

## 2012-10-06 MED ORDER — GLYCOPYRROLATE 0.2 MG/ML IJ SOLN
INTRAMUSCULAR | Status: DC | PRN
  Administered 2012-10-06: .8 mg via INTRAVENOUS

## 2012-10-06 MED ORDER — POTASSIUM CHLORIDE IN NACL 20-0.45 MEQ/L-% IV SOLN
INTRAVENOUS | Status: DC
  Administered 2012-10-06 – 2012-10-07 (×2): via INTRAVENOUS
  Filled 2012-10-06 (×5): qty 1000

## 2012-10-06 MED ORDER — DEXAMETHASONE SODIUM PHOSPHATE 10 MG/ML IJ SOLN
INTRAMUSCULAR | Status: DC | PRN
  Administered 2012-10-06: 10 mg via INTRAVENOUS

## 2012-10-06 MED ORDER — PHENYLEPHRINE HCL 10 MG/ML IJ SOLN
INTRAMUSCULAR | Status: DC | PRN
  Administered 2012-10-06: 40 ug via INTRAVENOUS

## 2012-10-06 MED ORDER — SODIUM CHLORIDE 0.9 % IV SOLN: 250.0000 mL | INTRAVENOUS | Status: DC

## 2012-10-06 MED ORDER — DIPHENHYDRAMINE HCL 50 MG/ML IJ SOLN
12.5000 mg | Freq: Once | INTRAMUSCULAR | Status: AC
  Administered 2012-10-06: 12.5 mg via INTRAVENOUS

## 2012-10-06 MED ORDER — SODIUM CHLORIDE 0.9 % IR SOLN
Status: DC | PRN
  Administered 2012-10-06: 08:00:00

## 2012-10-06 MED ORDER — OXYCODONE-ACETAMINOPHEN 5-325 MG PO TABS
1.0000 | ORAL_TABLET | ORAL | Status: DC | PRN
  Administered 2012-10-07: 1 via ORAL
  Filled 2012-10-06: qty 1

## 2012-10-06 MED ORDER — ROCURONIUM BROMIDE 100 MG/10ML IV SOLN
INTRAVENOUS | Status: DC | PRN
  Administered 2012-10-06: 50 mg via INTRAVENOUS
  Administered 2012-10-06: 5 mg via INTRAVENOUS
  Administered 2012-10-06: 10 mg via INTRAVENOUS

## 2012-10-06 MED ORDER — POLYETHYLENE GLYCOL 3350 17 G PO PACK
17.0000 g | PACK | Freq: Every day | ORAL | Status: DC | PRN
  Administered 2012-10-09: 17 g via ORAL
  Filled 2012-10-06: qty 1

## 2012-10-06 MED ORDER — CEFAZOLIN SODIUM-DEXTROSE 2-3 GM-% IV SOLR
INTRAVENOUS | Status: AC
  Administered 2012-10-06: 2 g via INTRAVENOUS
  Filled 2012-10-06: qty 50

## 2012-10-06 MED ORDER — BACITRACIN 50000 UNITS IM SOLR
INTRAMUSCULAR | Status: AC
  Filled 2012-10-06: qty 1

## 2012-10-06 MED ORDER — NEOSTIGMINE METHYLSULFATE 1 MG/ML IJ SOLN
INTRAMUSCULAR | Status: DC | PRN
  Administered 2012-10-06: 5 mg via INTRAVENOUS

## 2012-10-06 MED ORDER — OMEPRAZOLE 20 MG PO CPDR
20.0000 mg | DELAYED_RELEASE_CAPSULE | Freq: Two times a day (BID) | ORAL | Status: DC
  Administered 2012-10-06 – 2012-10-09 (×6): 20 mg via ORAL
  Filled 2012-10-06 (×8): qty 1

## 2012-10-06 MED ORDER — HYDROMORPHONE HCL PF 1 MG/ML IJ SOLN
0.5000 mg | INTRAMUSCULAR | Status: DC | PRN
  Administered 2012-10-06: 1 mg via INTRAVENOUS
  Administered 2012-10-06 (×3): 0.5 mg via INTRAVENOUS
  Administered 2012-10-07 (×2): 1 mg via INTRAVENOUS
  Administered 2012-10-07: 0.5 mg via INTRAVENOUS
  Filled 2012-10-06 (×7): qty 1

## 2012-10-06 MED ORDER — TRAMADOL HCL 50 MG PO TABS
50.0000 mg | ORAL_TABLET | Freq: Four times a day (QID) | ORAL | Status: DC | PRN
  Administered 2012-10-06: 50 mg via ORAL
  Filled 2012-10-06 (×2): qty 1

## 2012-10-06 MED ORDER — LIDOCAINE-EPINEPHRINE 1 %-1:100000 IJ SOLN
INTRAMUSCULAR | Status: DC | PRN
  Administered 2012-10-06: 10 mL

## 2012-10-06 MED ORDER — BACITRACIN ZINC 500 UNIT/GM EX OINT
TOPICAL_OINTMENT | CUTANEOUS | Status: DC | PRN
  Administered 2012-10-06: 1 via TOPICAL

## 2012-10-06 MED ORDER — ACETAMINOPHEN 10 MG/ML IV SOLN
INTRAVENOUS | Status: AC
  Administered 2012-10-06: 1000 mg via INTRAVENOUS
  Filled 2012-10-06: qty 100

## 2012-10-06 MED ORDER — LIDOCAINE HCL (CARDIAC) 20 MG/ML IV SOLN
INTRAVENOUS | Status: DC | PRN
  Administered 2012-10-06: 80 mg via INTRAVENOUS

## 2012-10-06 MED ORDER — ASPIRIN EC 81 MG PO TBEC
81.0000 mg | DELAYED_RELEASE_TABLET | Freq: Every day | ORAL | Status: DC
  Administered 2012-10-06 – 2012-10-09 (×4): 81 mg via ORAL
  Filled 2012-10-06 (×4): qty 1

## 2012-10-06 MED ORDER — MENTHOL 3 MG MT LOZG: 1.0000 | LOZENGE | OROMUCOSAL | Status: DC | PRN

## 2012-10-06 MED ORDER — ACETAMINOPHEN 325 MG PO TABS: 650.0000 mg | ORAL_TABLET | ORAL | Status: DC | PRN

## 2012-10-06 MED ORDER — HYDROMORPHONE HCL PF 1 MG/ML IJ SOLN
0.2500 mg | INTRAMUSCULAR | Status: DC | PRN
  Administered 2012-10-06 (×2): 0.5 mg via INTRAVENOUS

## 2012-10-06 MED ORDER — ONDANSETRON HCL 4 MG/2ML IJ SOLN: 4.0000 mg | INTRAMUSCULAR | Status: DC | PRN

## 2012-10-06 MED ORDER — ASPIRIN 81 MG PO TABS: 81.0000 mg | ORAL_TABLET | Freq: Every day | ORAL | Status: DC

## 2012-10-06 MED ORDER — PROPOFOL 10 MG/ML IV BOLUS
INTRAVENOUS | Status: DC | PRN
  Administered 2012-10-06: 130 mg via INTRAVENOUS

## 2012-10-06 MED ORDER — HYDROCHLOROTHIAZIDE 25 MG PO TABS
25.0000 mg | ORAL_TABLET | Freq: Every day | ORAL | Status: DC
  Administered 2012-10-06 – 2012-10-09 (×3): 25 mg via ORAL
  Filled 2012-10-06 (×5): qty 1

## 2012-10-06 MED ORDER — EPHEDRINE SULFATE 50 MG/ML IJ SOLN
INTRAMUSCULAR | Status: DC | PRN
  Administered 2012-10-06: 5 mg via INTRAVENOUS

## 2012-10-06 MED ORDER — ONDANSETRON HCL 4 MG/2ML IJ SOLN
INTRAMUSCULAR | Status: DC | PRN
  Administered 2012-10-06: 4 mg via INTRAVENOUS

## 2012-10-06 MED ORDER — THROMBIN 20000 UNITS EX SOLR
CUTANEOUS | Status: DC | PRN
  Administered 2012-10-06: 08:00:00 via TOPICAL

## 2012-10-06 MED ORDER — SODIUM CHLORIDE 0.9 % IJ SOLN: 3.0000 mL | INTRAMUSCULAR | Status: DC | PRN

## 2012-10-06 MED ORDER — HYDROMORPHONE HCL PF 1 MG/ML IJ SOLN
INTRAMUSCULAR | Status: AC
  Filled 2012-10-06: qty 1

## 2012-10-06 MED ORDER — BUPIVACAINE HCL (PF) 0.25 % IJ SOLN
INTRAMUSCULAR | Status: DC | PRN
  Administered 2012-10-06: 10 mL

## 2012-10-06 MED ORDER — DIPHENHYDRAMINE HCL 50 MG/ML IJ SOLN
INTRAMUSCULAR | Status: AC
  Filled 2012-10-06: qty 1

## 2012-10-06 MED ORDER — ONDANSETRON HCL 4 MG/2ML IJ SOLN
INTRAMUSCULAR | Status: AC
  Filled 2012-10-06: qty 2

## 2012-10-06 MED ORDER — LACTATED RINGERS IV SOLN
INTRAVENOUS | Status: DC | PRN
  Administered 2012-10-06: 07:00:00 via INTRAVENOUS

## 2012-10-06 MED ORDER — PHENOL 1.4 % MT LIQD: 1.0000 | OROMUCOSAL | Status: DC | PRN

## 2012-10-06 MED ORDER — 0.9 % SODIUM CHLORIDE (POUR BTL) OPTIME
TOPICAL | Status: DC | PRN
  Administered 2012-10-06: 1000 mL

## 2012-10-06 MED ORDER — SODIUM CHLORIDE 0.9 % IJ SOLN
3.0000 mL | Freq: Two times a day (BID) | INTRAMUSCULAR | Status: DC
  Administered 2012-10-06: 3 mL via INTRAVENOUS
  Administered 2012-10-06: 13:00:00 via INTRAVENOUS
  Administered 2012-10-07 – 2012-10-09 (×4): 3 mL via INTRAVENOUS

## 2012-10-06 SURGICAL SUPPLY — 66 items
BAG DECANTER FOR FLEXI CONT (MISCELLANEOUS) ×2 IMPLANT
BENZOIN TINCTURE PRP APPL 2/3 (GAUZE/BANDAGES/DRESSINGS) ×2 IMPLANT
BIT DRILL 2.4XNS REUSE 3.5X (BIT) ×1 IMPLANT
BIT DRL 2.4XNS REUSE 3.5X (BIT) ×1
BLADE SURG 11 STRL SS (BLADE) ×2 IMPLANT
BLADE SURG ROTATE 9660 (MISCELLANEOUS) ×2 IMPLANT
BUR MATCHSTICK NEURO 3.0 LAGG (BURR) ×2 IMPLANT
CANISTER SUCTION 2500CC (MISCELLANEOUS) ×2 IMPLANT
CAP ELLIPSE LOCKING (Cap) ×20 IMPLANT
CLOTH BEACON ORANGE TIMEOUT ST (SAFETY) ×2 IMPLANT
CONT SPEC 4OZ CLIKSEAL STRL BL (MISCELLANEOUS) ×2 IMPLANT
DECANTER SPIKE VIAL GLASS SM (MISCELLANEOUS) ×2 IMPLANT
DERMABOND ADVANCED (GAUZE/BANDAGES/DRESSINGS) ×1
DERMABOND ADVANCED .7 DNX12 (GAUZE/BANDAGES/DRESSINGS) ×1 IMPLANT
DRAPE C-ARM 42X72 X-RAY (DRAPES) IMPLANT
DRAPE LAPAROTOMY 100X72 PEDS (DRAPES) ×2 IMPLANT
DRAPE MICROSCOPE ZEISS OPMI (DRAPES) IMPLANT
DRAPE POUCH INSTRU U-SHP 10X18 (DRAPES) ×2 IMPLANT
DRAPE SURG 17X23 STRL (DRAPES) ×8 IMPLANT
DRILL BIT 3.5 (BIT) ×1
DRSG OPSITE 4X5.5 SM (GAUZE/BANDAGES/DRESSINGS) ×2 IMPLANT
ELECT REM PT RETURN 9FT ADLT (ELECTROSURGICAL) ×2
ELECTRODE REM PT RTRN 9FT ADLT (ELECTROSURGICAL) ×1 IMPLANT
EVACUATOR 1/8 PVC DRAIN (DRAIN) ×2 IMPLANT
GAUZE SPONGE 4X4 16PLY XRAY LF (GAUZE/BANDAGES/DRESSINGS) ×2 IMPLANT
GLOVE BIO SURGEON STRL SZ8 (GLOVE) ×2 IMPLANT
GLOVE ECLIPSE 7.5 STRL STRAW (GLOVE) ×6 IMPLANT
GLOVE EXAM NITRILE LRG STRL (GLOVE) IMPLANT
GLOVE EXAM NITRILE MD LF STRL (GLOVE) IMPLANT
GLOVE EXAM NITRILE XL STR (GLOVE) IMPLANT
GLOVE EXAM NITRILE XS STR PU (GLOVE) IMPLANT
GLOVE INDICATOR 8.0 STRL GRN (GLOVE) ×2 IMPLANT
GLOVE INDICATOR 8.5 STRL (GLOVE) ×2 IMPLANT
GOWN BRE IMP SLV AUR LG STRL (GOWN DISPOSABLE) IMPLANT
GOWN BRE IMP SLV AUR XL STRL (GOWN DISPOSABLE) ×2 IMPLANT
GOWN STRL REIN 2XL LVL4 (GOWN DISPOSABLE) IMPLANT
HEMOSTAT POWDER KIT SURGIFOAM (HEMOSTASIS) ×2 IMPLANT
KIT BASIN OR (CUSTOM PROCEDURE TRAY) ×2 IMPLANT
KIT ROOM TURNOVER OR (KITS) ×2 IMPLANT
MARKER SKIN DUAL TIP RULER LAB (MISCELLANEOUS) ×2 IMPLANT
NEEDLE HYPO 25X1 1.5 SAFETY (NEEDLE) ×2 IMPLANT
NEEDLE SPNL 20GX3.5 QUINCKE YW (NEEDLE) ×2 IMPLANT
NS IRRIG 1000ML POUR BTL (IV SOLUTION) ×2 IMPLANT
PACK LAMINECTOMY NEURO (CUSTOM PROCEDURE TRAY) ×2 IMPLANT
PAD ARMBOARD 7.5X6 YLW CONV (MISCELLANEOUS) ×6 IMPLANT
PIN MAYFIELD SKULL DISP (PIN) ×2 IMPLANT
PUTTY BONE DBX 2.5 MIS (Bone Implant) ×4 IMPLANT
ROD SPINE POST 3.5X80 (Rod) ×4 IMPLANT
RUBBERBAND STERILE (MISCELLANEOUS) ×4 IMPLANT
SCREW 3.5X12MM (Screw) ×18 IMPLANT
SCREW POLYAXIAL ELLIPSE 4X12 (Screw) ×2 IMPLANT
SPONGE GAUZE 4X4 12PLY (GAUZE/BANDAGES/DRESSINGS) ×2 IMPLANT
SPONGE LAP 4X18 X RAY DECT (DISPOSABLE) IMPLANT
SPONGE SURGIFOAM ABS GEL 100 (HEMOSTASIS) ×2 IMPLANT
STRIP CLOSURE SKIN 1/2X4 (GAUZE/BANDAGES/DRESSINGS) ×2 IMPLANT
SUT ETHILON 4 0 PS 2 18 (SUTURE) IMPLANT
SUT VIC AB 0 CT1 18XCR BRD8 (SUTURE) ×2 IMPLANT
SUT VIC AB 0 CT1 8-18 (SUTURE) ×2
SUT VIC AB 2-0 CT1 18 (SUTURE) ×4 IMPLANT
SUT VICRYL 4-0 PS2 18IN ABS (SUTURE) ×2 IMPLANT
SYR 20ML ECCENTRIC (SYRINGE) ×2 IMPLANT
TOWEL OR 17X24 6PK STRL BLUE (TOWEL DISPOSABLE) ×2 IMPLANT
TOWEL OR 17X26 10 PK STRL BLUE (TOWEL DISPOSABLE) ×2 IMPLANT
TRAP SPECIMEN MUCOUS 40CC (MISCELLANEOUS) ×2 IMPLANT
TRAY FOLEY CATH 14FRSI W/METER (CATHETERS) ×2 IMPLANT
WATER STERILE IRR 1000ML POUR (IV SOLUTION) ×2 IMPLANT

## 2012-10-06 NOTE — Transfer of Care (Signed)
Immediate Anesthesia Transfer of Care Note  Patient: Anna Lawson  Procedure(s) Performed: Procedure(s) with comments: POSTERIOR CERVICAL FUSION/FORAMINOTOMY LEVEL 4 (N/A) - cervical three-seven  Patient Location: PACU  Anesthesia Type:General  Level of Consciousness: awake, alert  and oriented  Airway & Oxygen Therapy: Patient Spontanous Breathing and Patient connected to nasal cannula oxygen  Post-op Assessment: Report given to PACU RN and Post -op Vital signs reviewed and stable  Post vital signs: Reviewed and stable  Complications: No apparent anesthesia complications

## 2012-10-06 NOTE — Anesthesia Preprocedure Evaluation (Addendum)
Anesthesia Evaluation  Patient identified by MRN, date of birth, ID band Patient awake    Reviewed: Allergy & Precautions, H&P , NPO status , Patient's Chart, lab work & pertinent test results  History of Anesthesia Complications Negative for: history of anesthetic complications  Airway Mallampati: I TM Distance: >3 FB Neck ROM: full    Dental  (+) Dental Advisory Given, Edentulous Upper and Edentulous Lower   Pulmonary shortness of breath and with exertion, asthma ,          Cardiovascular + Valvular Problems/Murmurs AS Rhythm:regular Rate:Normal     Neuro/Psych PSYCHIATRIC DISORDERS Anxiety  Neuromuscular disease    GI/Hepatic Neg liver ROS, GERD-  Medicated and Controlled,  Endo/Other  Morbid obesity  Renal/GU negative Renal ROS     Musculoskeletal negative musculoskeletal ROS (+)   Abdominal   Peds  Hematology negative hematology ROS (+)   Anesthesia Other Findings   Reproductive/Obstetrics negative OB ROS                         Anesthesia Physical Anesthesia Plan  ASA: III  Anesthesia Plan: General   Post-op Pain Management:    Induction: Intravenous  Airway Management Planned: Oral ETT  Additional Equipment:   Intra-op Plan:   Post-operative Plan: Extubation in OR  Informed Consent: I have reviewed the patients History and Physical, chart, labs and discussed the procedure including the risks, benefits and alternatives for the proposed anesthesia with the patient or authorized representative who has indicated his/her understanding and acceptance.     Plan Discussed with: CRNA, Anesthesiologist and Surgeon  Anesthesia Plan Comments:         Anesthesia Quick Evaluation

## 2012-10-06 NOTE — Preoperative (Signed)
Beta Blockers   Reason not to administer Beta Blockers:Not Applicable 

## 2012-10-06 NOTE — Op Note (Signed)
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  Postoperative diagnosis: Same  Procedure: Decompressive cervical laminectomy with partial fact that facetectomies and foraminotomies of C3-4, C4-5, C5-6, and C6-7 with foraminotomies of the C4, C5, C6, and C7 nerve roots  #2 posterior cervical fusion using the globus lateral mass screw system from C3-C7  #3 posterior lateral arthrodesis C3-C7 using local autograft mixed with DBX  #4 placement of a medium Hemovac drain.  Surgeon: Jillyn Hidden cram  Assistant: Colon Branch  Anesthesia: Gen.  EBL: Less than 200  History of present illness: Patient is a 62 year female is a progress worsening neck shoulder pain and tingling in her arms and hands weakness in her hands. 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 Due to patient's clinical exam and imaging findings and failure conservative treatment I recommended a posterior cervical decompression and fusion I went over the risks and benefits of the operation with her as well as perioperative course and expectations of outcome alternatives of surgery and she understood and agreed to proceed forward  Operative procedure: Patient was induced under general anesthesia and intubated maintaining careful in line traction then rolled prone maintaining neutral position and locked in [place] headrest in pins. The back of her neck was shaved prepped and draped in routine sterile fashion a midline incision was made after infiltration of 10 cc lidocaine with epi ambulatory status after subperiosteal dissections care lamina of C2 down to T1. The lateral masses from C3-C7 were exposed first attention second lateral mass screw placement using the inferomedial entry  point projecting to superior lateral quadrant pilot holes were started with a black max drill then drilled with a 12 mm TPS drill probed near cortex was tapped probed again and all 10 screws were placed there was one screw that stripped and be replaced with a rescue but otherwise all screws had excellent purchase. Then attention second the decompression spinous processes at C3 C4-C5 and C6 and part of C7 were removed decompression was begun with a 2 and a Kerrison punch marking laterally in the gutters and unroofing the central lamina to carefully decompress the spinal cord posteriorly. After complete central decompression was begun marked further out laterally performing partial facetectomies and foraminotomies of the C4, C5, C6, and C6 and C7 nerve roots. After adequate decompression achieved it was closely irrigated meticulous in space was maintained aggressive decortication was care the facet complexes at C3-4, C4-5, C5-6, C6-7 local autograft mixed DBX was packed in the facet complexes and along the lateral masses then 2 rods were placed slightly lower dosed top tightening nuts were tightened down and anchored at every level. Then some additional DBX was overlaid on top of the other graft material needing a drain was placed and the wounds closed in layers with after Vicryl after fluoroscopy confirmed good position of the screws rods. The skin was) 4 subcuticular benzoin Dermabond Steri-Strips and a dressing. Prior to closure a medium Hemovac drain was placed. At the end of case all needle counts and sponge counts were correct.

## 2012-10-06 NOTE — H&P (Signed)
Anna Lawson is an 62 y.o. female.   Chief Complaint: Neck pain bilateral hand pain numbness and weakness HPI: Patient is a very pleasant 62 year old female who presents with progressive worsening neck pain and pain into both arms upper commonly weakness numbness and tingling in her hands and fingers which has gotten progressively worse over the last several weeks and months. It does affect her right hand more than her left. She has been refractory to conservative treatment. Workup and imaging showed severe spinal cord compression and severe cervical stenosis at C3-4, C4-5, C5-6, and C6-7. Subsequent CAT scan showed OPLL behind C3 and C4. The patient was recommended decompression and fusion posteriorly at C3-4, C4-5, C5-6, and C6-7. Workup initially revealed a possible valve weakness in her heart so she was sent to cardiology and cardiothoracic surgery patient underwent extensive workup and even went to surgery for interoperative endoscopic evaluation apparently this also showed inadequate valve did not need to be replaced apparently was under anesthesia 5 hours during observation this time and the operation was not performed. Patient was subsequently cleared by cardiology and cardiothoracic surgery and presents for surgery. I have extensively reviewed the risks and benefits of the operation as well as perioperative course expectations about alternatives of surgery she understands and agrees to proceed forward.  Past Medical History  Diagnosis Date  . Spinal stenosis   . Hyperlipidemia   . Aortic stenosis     bicuspid AV, mid to mod AR, mod AS by 09/17/11 echo [facility])  . Shortness of breath     all time  . Ankle edema     takes HCTZ, both ankles  . Constipation   . History of blood transfusion 40 yrs ago  . Pre-operative cardiovascular examination   . Dementia     a little  . Heart murmur   . Asthma     denies  . GERD (gastroesophageal reflux disease)   . Neuromuscular disorder      muscle cramps in legs from lipid med    Past Surgical History  Procedure Laterality Date  . Cardiac catheterization  08-21-2012    03/26/05 [facility]): normal coronaries, normal LV function, mild AS/AI.  No MR/MS.  Normal PA pressures and hemodynamics.  . Multiple extractions with alveoloplasty N/A 09/15/2012    Procedure: MULTIPLE EXTRACTION WITH ALVEOLOPLASTY/PRE PROSTESTIC;  Surgeon: Charlynne Pander, DDS;  Location: [facility] ORS;  Service: Oral Surgery;  Laterality: N/A;  Extraction of tooth #9, [phone] with alveoloplasty and bilateral mandibular tori reductions  . Intraoperative transesophageal echocardiogram N/A 09/24/2012    Procedure: INTRAOPERATIVE TRANSESOPHAGEAL ECHOCARDIOGRAM;  Surgeon: Kerin Perna, MD;  Location: [facility] OR;  Service: Open Heart Surgery;  Laterality: N/A;    Family History  Problem Relation Age of Onset  . Emphysema Maternal Grandmother     never smoked  . Emphysema Paternal Uncle     never smoked  . Heart disease Mother     AGE.Marland KitchenMarland KitchenIN HER 50'S  . Diabetes Mother     TYPE 1  . Bladder Cancer Father   . Diabetes Brother     TYPE I   Social History:  reports that she quit smoking about 10 years ago. Her smoking use included Cigarettes. She has a 35 pack-year smoking history. She has never used smokeless tobacco. She reports that  drinks alcohol. She reports that she does not use illicit drugs.  Allergies:  Allergies  Allergen Reactions  . Adhesive (Tape)     Blisters ekg leads  .  Codeine Nausea And Vomiting    Medications Prior to Admission  Medication Sig Dispense Refill  . aspirin 81 MG tablet Take 81 mg by mouth daily after breakfast.       . DiphenhydrAMINE HCl (BENADRYL PO) Take 1 tablet by mouth every 6 (six) hours as needed. For allergies      . donepezil (ARICEPT) 10 MG tablet Take 10 mg by mouth at bedtime as needed.      . donepezil (ARICEPT) 5 MG tablet Take 10 mg by mouth at bedtime.       .  hydrochlorothiazide (HYDRODIURIL) 25 MG tablet Take 25 mg by mouth daily after breakfast.       . ibuprofen (ADVIL,MOTRIN) 200 MG tablet Take 600 mg by mouth every 6 (six) hours as needed for pain.       Marland Kitchen omeprazole (PRILOSEC OTC) 20 MG tablet Take 20 mg by mouth daily as needed. For heartburn      . polyethylene glycol (MIRALAX / GLYCOLAX) packet Take 17 g by mouth daily as needed. For constipation      . traMADol (ULTRAM) 50 MG tablet Take 50 mg by mouth every 6 (six) hours as needed. For pain      . OVER THE COUNTER MEDICATION Apply 1 application topically daily as needed. For rash/allergies        No results found for this or any previous visit (from the past 48 hour(s)). No results found.  Review of Systems  Constitutional: Negative.   HENT: Positive for neck pain.   Eyes: Negative.   Respiratory: Negative.   Cardiovascular: Negative.   Gastrointestinal: Negative.   Genitourinary: Negative.   Musculoskeletal: Positive for joint pain.  Skin: Negative.   Neurological: Positive for tingling and sensory change.  Endo/Heme/Allergies: Negative.   Psychiatric/Behavioral: Negative.     Blood pressure 108/73, pulse 79, temperature 98.1 F (36.7 C), temperature source Oral, resp. rate 20, SpO2 94.00%. Physical Exam  Constitutional: She is oriented to person, place, and time. She appears well-developed and well-nourished.  Eyes: Pupils are equal, round, and reactive to light.  Neck: Normal range of motion.  Cardiovascular: Normal rate.   Respiratory: Effort normal.  GI: Soft.  Neurological: She is alert and oriented to person, place, and time.  Skin: Skin is warm.     Assessment/Plan 62 year old female presents for posterior cervical decompression and fusion C3-C7  Jeanpierre Thebeau P 10/06/2012, 6:58 AM

## 2012-10-06 NOTE — Anesthesia Postprocedure Evaluation (Signed)
  Anesthesia Post-op Note  Patient: Anna Lawson  Procedure(s) Performed: Procedure(s) with comments: POSTERIOR CERVICAL FUSION/FORAMINOTOMY LEVEL 4 (N/A) - cervical three-seven  Patient Location: PACU  Anesthesia Type:General  Level of Consciousness: awake, alert , oriented and patient cooperative  Airway and Oxygen Therapy: Patient Spontanous Breathing  Post-op Pain: mild  Post-op Assessment: Post-op Vital signs reviewed, Patient's Cardiovascular Status Stable, Respiratory Function Stable, Patent Airway, No signs of Nausea or vomiting and Pain level controlled  Post-op Vital Signs: stable  Complications: No apparent anesthesia complications

## 2012-10-06 NOTE — Progress Notes (Signed)
Orthopedic Tech Progress Note Patient Details:  Anna Lawson 1950-12-10 [id number] Soft collar applied to patient. Tolerated well. Ortho Devices Type of Ortho Device: Soft collar Ortho Device/Splint Interventions: Application   Asia R Thompson 10/06/2012, 12:25 PM

## 2012-10-07 MED ORDER — HYDROMORPHONE HCL 2 MG PO TABS
2.0000 mg | ORAL_TABLET | ORAL | Status: DC | PRN
  Administered 2012-10-07 – 2012-10-08 (×4): 2 mg via ORAL
  Administered 2012-10-08: 4 mg via ORAL
  Administered 2012-10-09 (×3): 2 mg via ORAL
  Filled 2012-10-07 (×2): qty 1
  Filled 2012-10-07: qty 2
  Filled 2012-10-07 (×5): qty 1

## 2012-10-07 MED ORDER — DEXAMETHASONE SODIUM PHOSPHATE 10 MG/ML IJ SOLN
10.0000 mg | Freq: Once | INTRAMUSCULAR | Status: AC
  Administered 2012-10-07: 10 mg via INTRAVENOUS
  Filled 2012-10-07 (×2): qty 1

## 2012-10-07 NOTE — Progress Notes (Signed)
itching, redness on R arm under BP cuff and redness on face/chin around soft neck collar. BP cuff and neck collar removed and Benadryl 25 mg PO given. Pt reports easily irritated skin at baseline. Did not complain of SOB or any other s/s associated with an allergic reaction. Dr. Wynetta Emery notified and visualized pt. Order received for Decadron 10 mg IV. After medications given, improvement noted in redness and pt reported relief of itching on arm.   17:45 Pt stated that she believes the facial skin reaction was from the orange soap used during her bath. Per pt and family, soft collar reapplied. Will continue to monitor.   Holly Bodily

## 2012-10-07 NOTE — Progress Notes (Signed)
Subjective: Patient reports Overall she's doing well having a lot of incisional pain this morning when she was rolling over she heard a pop felt really good for a little while but then the pain started building she did have a more neck pain the symptoms in her arms and hands are significantly improved from preop there is a little more numbness in the first 2 fingers of her right hand and she had immediately postoperative but stable from last night even parietal pop strength remains stable at 4+ out of 5  Objective: Vital signs in last 24 hours: Temp:  [97.2 F (36.2 C)-98 F (36.7 C)] 97.7 F (36.5 C) (03/12 0821) Pulse Rate:  [55-108] 65 (03/12 0800) Resp:  [10-39] 14 (03/12 0800) BP: (94-152)/(43-89) 110/66 mmHg (03/12 0800) SpO2:  [91 %-97 %] 93 % (03/12 0800) Weight:  [96.3 kg (212 lb 4.9 oz)] 96.3 kg (212 lb 4.9 oz) (03/11 1700)  Intake/Output from previous day: 03/11 0701 - 03/12 0700 In: 2248.3 [P.O.:360; I.V.:1738.3; IV Piggyback:150] Out: 2515 [Urine:2210; Drains:135; Blood:170] Intake/Output this shift: Total I/O In: 50 [I.V.:50] Out: -   Strength 4+ out of 5 bilateral upper extremities incision clean and dry  Lab Results: No results found for this basename: WBC, HGB, HCT, PLT,  in the last 72 hours BMET No results found for this basename: NA, K, CL, CO2, GLUCOSE, BUN, CREATININE, CALCIUM,  in the last 72 hours  Studies/Results: Dg Cervical Spine 2-3 Views  10/06/2012  *RADIOLOGY REPORT*  Clinical Data: Post cervical spine fusion.  CERVICAL SPINE - 2-3 VIEW  Comparison: None.  Findings: Two intraoperative lateral views of the neck were obtained.  The bone detail is very limited on this examination. There is evidence for an endotracheal tube and nasogastric tube. The patient has undergone posterior spinal fusion at five levels. Surgical fusion appears to involve C3-C7.  IMPRESSION: Posterior cervical spine fusion as described.   Original Report Authenticated By: Richarda Overlie,  M.D.     Assessment/Plan: Patient doing well continue mobilizes today will transfer her out of the unit and a physical outpatient therapy.  LOS: 1 day     CRAM,GARY P 10/07/2012, 8:39 AM

## 2012-10-07 NOTE — Evaluation (Signed)
Physical Therapy Evaluation Patient Details Name: Anna Lawson MRN: [id number] DOB: April 07, 1951 Today's Date: 10/07/2012 Time: 4098-1191 PT Time Calculation (min): 44 min  PT Assessment / Plan / Recommendation Clinical Impression  62 y.o. female admitted to [facility] for posterior cervical fusion C3-C7.  She presents today POD #1 with posterior neck and shoulder pain bil, improved sensation compared to PTA and only needing min assist to mobilize OOB.  Family is interested in gatting a lift chair, but based on her preformance today I do not know that this will be necessary.  She would benefit from HHPT f/u at discharge to check her home environment (specifically her recliner and her bed mobility) and reinforce education on neck precautions.      PT Assessment  Patient needs continued PT services    Follow Up Recommendations  Home health PT;Supervision/Assistance - 24 hour    Does the patient have the potential to tolerate intense rehabilitation     NA  Barriers to Discharge None None    Equipment Recommendations  None recommended by PT (to assess possible need for lift chair/mechanism )    Recommendations for Other Services   none  Frequency Min 5X/week    Precautions / Restrictions Precautions Precautions: Fall;Cervical Precaution Comments: PTA she did not walk far and often needed the support of shopping cart/husband.  Cervical handout provided and reviewed with pt.   Required Braces or Orthoses: Cervical Brace Cervical Brace: Soft collar   Pertinent Vitals/Pain O2 sats decreased to 88% on RA after mobility, so 2 L O2 [place] re-applied by RN to nose in recliner chair.  All other VSS      Mobility  Bed Mobility Rolling Left: 4: Min assist Left Sidelying to Sit: 4: Min assist Sitting - Scoot to Edge of Bed: 4: Min assist Details for Bed Mobility Assistance: min assist to support trunk during transitions.  Verbal cues for log roll technique.   Transfers Transfers: Sit to  Stand;Stand to [employer] Transfers Sit to Stand: 4: Min guard;With upper extremity assist;From bed;From elevated surface Stand to Sit: 4: Min guard;With upper extremity assist;With armrests;To chair/3-in-1 Stand Pivot Transfers: 4: Min guard;From elevated surface Details for Transfer Assistance: min hand held assist to stand and take 5-6 steps to recliner chair.  Pt anxious, but moving well.   Ambulation/Gait Ambulation/Gait Assistance: 4: Min guard Ambulation Distance (Feet): 5 Feet Assistive device: 2 person hand held assist Ambulation/Gait Assistance Details: 2 person hand held assist used, but pt not putting much pressure through arms for stability.   Gait Pattern: Step-through pattern        PT Diagnosis: Difficulty walking;Abnormality of gait;Generalized weakness;Acute pain  PT Problem List: Decreased strength;Decreased activity tolerance;Decreased balance;Decreased mobility;Decreased knowledge of use of DME;Decreased knowledge of precautions;Pain;Impaired sensation PT Treatment Interventions: DME instruction;Gait training;Stair training;Functional mobility training;Therapeutic activities;Therapeutic exercise;Balance training;Neuromuscular re-education;Patient/family education   PT Goals Acute Rehab PT Goals PT Goal Formulation: With patient/family Time For Goal Achievement: 10/14/12 Potential to Achieve Goals: Good Pt will Roll Supine to Right Side: with modified independence PT Goal: Rolling Supine to Right Side - Progress: Goal set today Pt will Roll Supine to Left Side: with modified independence PT Goal: Rolling Supine to Left Side - Progress: Goal set today Pt will go Supine/Side to Sit: with modified independence PT Goal: Supine/Side to Sit - Progress: Goal set today Pt will go Sit to Supine/Side: with modified independence PT Goal: Sit to Supine/Side - Progress: Goal set today Pt will go Sit to Stand: with  supervision PT Goal: Sit to Stand - Progress: Goal set  today Pt will go Stand to Sit: with supervision PT Goal: Stand to Sit - Progress: Goal set today Pt will Transfer Bed to Chair/Chair to Bed: with supervision PT Transfer Goal: Bed to Chair/Chair to Bed - Progress: Goal set today Pt will Ambulate: with supervision;with least restrictive assistive device PT Goal: Ambulate - Progress: Goal set today Pt will Go Up / Down Stairs: with min assist;1-2 stairs PT Goal: Up/Down Stairs - Progress: Goal set today Additional Goals Additional Goal #1: Pt will demonstrate compliance with cervical precautions during 100% of treatment session.   PT Goal: Additional Goal #1 - Progress: Goal set today  Visit Information  Last PT Received On: 10/07/12 Assistance Needed: +1    Subjective Data  Subjective: Pt anxious about getting in/out of her bed at home.  Plans to sleep in her recliner.  Wondering if she will need a lift chair due to the fact that she cannot push or pull more than 5# with her arms.  Anxious personality Patient Stated Goal: to be as independent as possible especially with her toileting needs.     Prior Functioning  Home Living Lives With: Spouse (son and daughter-in-law live in same apartment complex) Available Help at Discharge: Family;Available 24 hours/day Type of Home: Apartment Home Access: Stairs to enter Entrance Stairs-Number of Steps: 1 Entrance Stairs-Rails: None Home Layout: One level Bathroom Shower/Tub: Forensic scientist: Standard Bathroom Accessibility: Yes How Accessible: Accessible via walker Additional Comments: didn't walk well PTA, could not walk any distance and used the cart at the store for stability Prior Function Level of Independence: Needs assistance Needs Assistance: Dressing;Meal Prep;Light Housekeeping Dressing: Other (comment) (fastening her bra) Meal Prep: Maximal Light Housekeeping: Moderate Driving: No Vocation: Full time employment Comments: nurse, 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   Communication Communication: No difficulties Dominant Hand: Right    Cognition  Cognition Overall Cognitive Status: Appears within functional limits for tasks assessed/performed Arousal/Alertness: Awake/alert Orientation Level: Appears intact for tasks assessed Behavior During Session: Anxious Cognition - Other Comments: pt anxious re-mobility, posture, breathing, pain.      Extremity/Trunk Assessment Right Upper Extremity Assessment RUE Sensation: Deficits RUE Sensation Deficits: in c6 dermatomes.   Right Lower Extremity Assessment RLE ROM/Strength/Tone: Deficits RLE ROM/Strength/Tone Deficits: grossly 4/5 per functional assessment Left Lower Extremity Assessment LLE ROM/Strength/Tone: Deficits LLE ROM/Strength/Tone Deficits: grossly 4/5 per functional assessment.    Balance Static Sitting Balance Static Sitting - Balance Support: No upper extremity supported;Feet unsupported Static Sitting - Level of Assistance: 5: Stand by assistance Static Sitting - Comment/# of Minutes: supervision EOB x 3 mins in prep for standing.  Pt reports feeling weak and light headed. BP checked and is stable.  Pt able to continue Static Standing Balance Static Standing - Balance Support: Bilateral upper extremity supported Static Standing - Level of Assistance: 4: Min assist Static Standing - Comment/# of Minutes: 1 min EOB while preparing to take steps to recliner chair.    End of Session PT - End of Session Equipment Utilized During Treatment: Cervical collar;Oxygen (had to re-apply O2 at end of session due to O2 sats 88% on R) Activity Tolerance: Patient limited by fatigue;Patient limited by pain Patient left: in chair;with call bell/phone within reach;with family/visitor  present (husband and daughter-in-law) Nurse Communication:  Mobility status;Patient requests pain meds    Rebecca B. Medendorp, PT, DPT [phone]   10/07/2012, 10:30 AM

## 2012-10-07 NOTE — Clinical Social Work Note (Signed)
Clinical Social Worker received referral for possible ST-SNF placement.  Chart reviewed.  PT/OT recommending home with home health.  Spoke with RN Case Manager who will follow up with patient to discuss home health needs.    CSW signing off - please re consult if social work needs arise.  Jesse Scinto, LCSW [phone] 

## 2012-10-07 NOTE — Evaluation (Signed)
Occupational Therapy Evaluation Patient Details Name: Anna Lawson MRN: [id number] DOB: 1951/02/07 Today's Date: 10/07/2012 Time: 1328-1400 OT Time Calculation (min): 32 min  OT Assessment / Plan / Recommendation Clinical Impression  This 62 yo female s/p  posterior cervical fusion C3-C7 presents to acute OT with problems below. WIll benefit from acute OT with follow up HHOT.    OT Assessment  Patient needs continued OT Services    Follow Up Recommendations  Home health OT    Barriers to Discharge None    Equipment Recommendations  Tub/shower seat (lift chair due to all surfaces at home high)       Frequency  Min 3X/week    Precautions / Restrictions Precautions Precautions: Fall;Cervical Precaution Comments: PTA she did not walk far and often needed the support of shopping cart/husband.  Cervical handout provided and reviewed with pt.   Required Braces or Orthoses: Cervical Brace Cervical Brace: Soft collar Restrictions Weight Bearing Restrictions: No   Pertinent Vitals/Pain 8/10 arms and neck    ADL  Eating/Feeding: Simulated;Supervision/safety;Set up Where Assessed - Eating/Feeding: Chair Grooming: Simulated;Supervision/safety;Set up Where Assessed - Grooming: Supported sitting Upper Body Bathing: Simulated;Moderate assistance Where Assessed - Upper Body Bathing: Supported sitting Lower Body Bathing: Simulated;+1 Total assistance Where Assessed - Lower Body Bathing: Supported sit to stand Upper Body Dressing: +1 Total assistance Where Assessed - Upper Body Dressing: Supported sitting Lower Body Dressing: Simulated;+1 Total assistance Where Assessed - Lower Body Dressing: Supported sit to Pharmacist, hospital: Performed;Min guard Statistician Method: Sit to Barista: Regular height toilet Toileting - Clothing Manipulation and Hygiene: Performed;Independent (with front peri care) Where Assessed - Toileting Clothing Manipulation and  Hygiene: Sit to stand from 3-in-1 or toilet Equipment Used: Rolling walker (soft cervical collar) Transfers/Ambulation Related to ADLs: Min guard A for all with RW    OT Diagnosis: Generalized weakness;Acute pain  OT Problem List: Decreased strength;Impaired balance (sitting and/or standing);Pain;Decreased knowledge of precautions;Decreased knowledge of use of DME or AE OT Treatment Interventions: Self-care/ADL training;DME and/or AE instruction;Patient/family education;Balance training   OT Goals Acute Rehab OT Goals OT Goal Formulation: With patient Time For Goal Achievement: 10/14/12 Potential to Achieve Goals: Good ADL Goals Pt Will Perform Lower Body Bathing: with min assist;Sit to stand from bed;Sit to stand from chair;with adaptive equipment ADL Goal: Lower Body Bathing - Progress: Goal set today Pt Will Perform Lower Body Dressing: with min assist;Sit to stand from chair;Sit to stand from bed;with adaptive equipment ADL Goal: Lower Body Dressing - Progress: Goal set today Pt Will Perform Toileting - Hygiene: with modified independence;with adaptive equipment;Sit to stand from 3-in-1/toilet ADL Goal: Toileting - Hygiene - Progress: Goal set today Arm Goals Pt Will Perform AROM: Independently;Right upper extremity;1 set;10 reps (supine, RUE only to 90 degrees flexon) Arm Goal: AROM - Progress: Goal set today  Visit Information  Last OT Received On: 10/07/12 Assistance Needed: +1    Subjective Data  Subjective: I really feel I need a lift chair because everything I have at home to sit on is high and I cannot be pushing with my arms to get back in/on the surface since I have short legs   Prior Functioning     Home Living Lives With: Spouse Available Help at Discharge: Family;Available 24 hours/day Type of Home: Apartment Home Access: Stairs to enter Entrance Stairs-Number of Steps: 1 Entrance Stairs-Rails: None Home Layout: One level Bathroom Shower/Tub: Investment banker, corporate: Standard Bathroom Accessibility: Yes How Accessible: Accessible via  walker Additional Comments: didn't walk well PTA, could not walk any distance and used the cart at the store for stability Prior Function Level of Independence: Needs assistance Needs Assistance: Dressing;Meal Prep;Light Housekeeping Dressing: Other (comment) Meal Prep: Maximal Light Housekeeping: Moderate Driving: No Vocation: Full time employment Comments: LPN, 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 She would like to work again, but is not sure she will be able to. Communication Communication: No difficulties Dominant Hand: Right         Vision/Perception Vision - History Baseline Vision: No visual deficits   Cognition  Cognition Overall Cognitive Status: Appears within functional limits for tasks assessed/performed Arousal/Alertness: Awake/alert Orientation Level: Appears intact for tasks assessed Behavior During Session: [facility] for tasks performed    Extremity/Trunk Assessment Right Upper Extremity Assessment RUE ROM/Strength/Tone: Deficits RUE ROM/Strength/Tone Deficits: Supine--can get shoulder flexion to 90 degrees with increased time and effort RUE Coordination: Deficits RUE Coordination Deficits: GM at shoulder Left Upper Extremity Assessment LUE ROM/Strength/Tone: Deficits LUE ROM/Strength/Tone Deficits: Supine--can get shoulder flexion to 90 degree without issues LUE Coordination: Deficits LUE Coordination Deficits: GM only due to cervical precautions     Mobility Bed Mobility Bed Mobility: Sit to Sidelying Left;Scooting to HOB Sit to Sidelying Left: 3: Mod assist;With rail;HOB flat Scooting to HOB: 3: Mod assist (with pt pushing with her feet, while headed supported) Transfers Transfers: Sit to Stand;Stand to [employer] to Stand: 4: Min guard;Without upper extremity assist;From chair/3-in-1 Stand to Sit:  4: Min guard;Without upper extremity assist;To bed           End of Session OT - End of Session Activity Tolerance: Patient tolerated treatment well Patient left: in bed       Evette Georges [phone] 10/07/2012, 2:21 PM

## 2012-10-07 NOTE — Progress Notes (Signed)
UR completed 

## 2012-10-07 NOTE — Plan of Care (Signed)
Problem: Consults Goal: Diagnosis - Spinal Surgery Outcome: Completed/Met Date Met:  10/07/12 Cervical Spine Fusion     

## 2012-10-08 MED ORDER — DONEPEZIL HCL 10 MG PO TABS: 10.0000 mg | ORAL_TABLET | Freq: Every day | ORAL | Status: DC

## 2012-10-08 NOTE — Progress Notes (Addendum)
Occupational Therapy Treatment Patient Details Name: Anna Lawson MRN: [id number] DOB: 11/20/1950 Today's Date: 10/08/2012 Time: 4696-2952 OT Time Calculation (min): 23 min  OT Assessment / Plan / Recommendation Comments on Treatment Session This 62 yo female making excellent progress.    Follow Up Recommendations  Home health OT       Equipment Recommendations  Tub/shower seat (if insurance covers--husband checking)       Frequency Min 3X/week   Plan Discharge plan remains appropriate    Precautions / Restrictions Precautions Precautions: Fall;Cervical Required Braces or Orthoses: Cervical Brace Cervical Brace: Soft collar Restrictions Weight Bearing Restrictions: No       ADL  Lower Body Dressing: Performed;Set up;Supervision/safety (with AE, socks) Where Assessed - Lower Body Dressing: Unsupported sit to stand Toilet Transfer: Performed;Supervision/safety Toilet Transfer Method: Sit to stand Toilet Transfer Equipment: Regular height toilet Toileting - Clothing Manipulation and Hygiene: Performed;Independent (front peri area) Where Assessed - Toileting Clothing Manipulation and Hygiene: Sit to stand from 3-in-1 or toilet ADL Comments: I did issue pt a toliet aid due to gift shop did not have any     OT Goals ADL Goals ADL Goal: Lower Body Dressing - Progress: Met ADL Goal: Toileting - Hygiene - Progress: Progressing toward goals Miscellaneous OT Goals Miscellaneous OT Goal #1: Pt will be S in/out of tub to tub seat OT Goal: Miscellaneous Goal #1 - Progress: Goal set today  Visit Information  Last OT Received On: 10/08/12 Assistance Needed: +1          Cognition  Cognition Overall Cognitive Status: Appears within functional limits for tasks assessed/performed Arousal/Alertness: Awake/alert Orientation Level: Appears intact for tasks assessed Behavior During Session: [facility] for tasks performed    Mobility  Transfers Transfers: Sit to Stand;Stand to  Sit Sit to Stand: 5: Supervision;Without upper extremity assist;From chair/3-in-1 Stand to Sit: 5: Supervision;Without upper extremity assist;To chair/3-in-1          End of Session OT - End of Session Activity Tolerance: Patient tolerated treatment well Patient left: in chair;with family/visitor present       Evette Georges [phone] 10/08/2012, 1:53 PM

## 2012-10-08 NOTE — Progress Notes (Signed)
Occupational Therapy Treatment Patient Details Name: VERNETTE MOISE MRN: [id number] DOB: [birth year] Today's Date: 10/08/2012 Time: 4098-1191 OT Time Calculation (min): 14 min  OT Assessment / Plan / Recommendation Comments on Treatment Session This 62 yo continuing to progress, only needs tub transfer practice next visit.    Follow Up Recommendations  Home health OT       Equipment Recommendations  Tub/shower seat (with back--if insurance covers--husband checking)       Frequency Min 3X/week   Plan Discharge plan remains appropriate    Precautions / Restrictions Precautions Precautions: Fall;Cervical Required Braces or Orthoses: Cervical Brace Cervical Brace: Soft collar Restrictions Weight Bearing Restrictions: No       ADL  Lower Body Dressing: Performed;Set up;Supervision/safety (use of AE for donning/doffing underwear) Where Assessed - Lower Body Dressing: Unsupported sit to stand Toilet Transfer: Performed;Supervision/safety Toilet Transfer Method: Sit to stand Toilet Transfer Equipment: Regular height toilet Toileting - Clothing Manipulation and Hygiene: Performed;Independent (front peri area) Where Assessed - Toileting Clothing Manipulation and Hygiene: Sit to stand from 3-in-1 or toilet ADL Comments: Pt was thinking that I wanted to do a shower with her. I explained it was not the shower I wanted to work with her on, but rather the tub transfer to a shower seat. Explained that she could use the reacher, toilet aid, and long handled sponge to  A her with bathing and that her husband will have to A her with washing her hair until her arms get stronger and they are not as painfull.       OT Goals ADL Goals Pt Will Perform Lower Body Dressing: with min assist;Sit to stand from bed;with adaptive equipment;Unsupported ADL Goal: Lower Body Dressing - Progress: Met ADL Goal: Toileting - Hygiene - Progress: Progressing toward goals Miscellaneous OT Goals Miscellaneous  OT Goal #1: Pt will be S in/out of tub to tub seat OT Goal: Miscellaneous Goal #1 - Progress: Goal set today  Visit Information  Last OT Received On: 10/08/12 Assistance Needed: +1          Cognition  Cognition Overall Cognitive Status: Appears within functional limits for tasks assessed/performed Arousal/Alertness: Awake/alert Orientation Level: Appears intact for tasks assessed Behavior During Session: [facility] for tasks performed    Mobility  Transfers Transfers: Sit to Stand;Stand to Sit Sit to Stand: 5: Supervision;Without upper extremity assist;From chair/3-in-1 Stand to Sit: 5: Supervision;Without upper extremity assist;To chair/3-in-1          End of Session OT - End of Session Activity Tolerance: Patient tolerated treatment well Patient left: in chair;with family/visitor present       Evette Georges [phone] 10/08/2012, 2:06 PM

## 2012-10-08 NOTE — Progress Notes (Signed)
Physical Therapy Treatment Patient Details Name: Anna Lawson MRN: [id number] DOB: 06/26/1951 Today's Date: 10/08/2012 Time: 4098-1191 PT Time Calculation (min): 26 min  PT Assessment / Plan / Recommendation Comments on Treatment Session  62 y.o. female POD #2 s/p posterior cervical fusion.  She presents with much better mobility today requiring min assist for gait.  She would benefit from hospital bed (due to her bed is too high for her to get into and keep her cervical precautions), and a rollator for home use.  HHPT safety eval would also be beneficial to reinforce education and check pt's mobility at home.      Follow Up Recommendations  Home health PT;Supervision - Intermittent     Does the patient have the potential to tolerate intense rehabilitation    NA  Barriers to Discharge   none      Equipment Recommendations  Hospital bed;Other (comment) (rollator with seat)    Recommendations for Other Services   none  Frequency Min 5X/week   Plan Discharge plan remains appropriate;Frequency remains appropriate    Precautions / Restrictions Precautions Precautions: Fall;Cervical Precaution Comments: PTA she did not walk far and often needed the support of shopping cart/husband.  Cervical precautions reviewed with pt.   Required Braces or Orthoses: Cervical Brace Cervical Brace: Soft collar   Pertinent Vitals/Pain O2 sats 90-92% after gait on RA, DOE 2/4, HR max 124 bpm with gait.      Mobility  Bed Mobility Rolling Left: 6: Modified independent (Device/Increase time) Left Sidelying to Sit: 4: Min assist;HOB flat Sitting - Scoot to Edge of Bed: 5: Supervision Details for Bed Mobility Assistance: pt only needed assist of trunk to get to sitting from flat bed.   Transfers Sit to Stand: 4: Min guard;From bed;With upper extremity assist Stand to Sit: 5: Supervision;With upper extremity assist;To chair/3-in-1;To toilet Details for Transfer Assistance: min guard assist to stand  especially from lower surfaces for safety and balance.  Supervision for safety to sit  Ambulation/Gait Ambulation/Gait Assistance: 5: Supervision Ambulation Distance (Feet): 240 Feet Assistive device: Rolling walker Ambulation/Gait Assistance Details: supervision for safety and verbal cues for upright posture.  Good gait speed with RW.   Gait Pattern: Step-through pattern;Trunk flexed (head forward of BOS) General Gait Details: O2 sats after gait 90-92% on RA, DOE 2/4, HR increased to 124 bpm      PT Goals Acute Rehab PT Goals PT Goal: Rolling Supine to Left Side - Progress: Met PT Goal: Supine/Side to Sit - Progress: Progressing toward goal PT Goal: Sit to Stand - Progress: Progressing toward goal PT Goal: Stand to Sit - Progress: Met Pt will Ambulate: >150 feet;with supervision;with least restrictive assistive device PT Goal: Ambulate - Progress: Met Additional Goals PT Goal: Additional Goal #1 - Progress: Progressing toward goal  Visit Information  Last PT Received On: 10/08/12 Assistance Needed: +1    Subjective Data  Subjective: Pt states that her insurance will not cover the chair or the lift mechanism for a lift chair.  She is looking at renting one and possibly getting a hospital bed and rollator.  We will need to practice with the rollator tomorrow AM to see if this is the prefered assistive device.     Cognition  Cognition Overall Cognitive Status: Appears within functional limits for tasks assessed/performed Arousal/Alertness: Awake/alert Orientation Level: Appears intact for tasks assessed Behavior During Session: [facility] for tasks performed    Balance  Static Sitting Balance Static Sitting - Balance Support: No upper extremity  supported;Feet supported Static Sitting - Level of Assistance: 7: Independent Static Standing Balance Static Standing - Balance Support: Bilateral upper extremity supported Static Standing - Level of Assistance: 5: Stand by assistance  End of  Session PT - End of Session Equipment Utilized During Treatment: Cervical collar Activity Tolerance: Patient limited by fatigue;Patient limited by pain Patient left: in chair;with call bell/phone within reach     Springer B. Medendorp, PT, DPT [phone]   10/08/2012, 9:45 AM

## 2012-10-08 NOTE — Progress Notes (Signed)
Subjective: Patient reports Too much better this morning less pain in her neck no numbness or tingling her fingers  Objective: Vital signs in last 24 hours: Temp:  [97.4 F (36.3 C)-98.3 F (36.8 C)] 97.4 F (36.3 C) (03/13 0410) Pulse Rate:  [61-94] 61 (03/13 0410) Resp:  [13-21] 13 (03/13 0410) BP: (107-128)/(52-78) 121/68 mmHg (03/13 0410) SpO2:  [88 %-96 %] 93 % (03/13 0410)  Intake/Output from previous day: 03/12 0701 - 03/13 0700 In: 645 [P.O.:320; I.V.:225; IV Piggyback:100] Out: 2485 [Urine:2425; Drains:60] Intake/Output this shift:    Strength stable for placenta 5 wound is clean and dry  Lab Results: No results found for this basename: WBC, HGB, HCT, PLT,  in the last 72 hours BMET No results found for this basename: NA, K, CL, CO2, GLUCOSE, BUN, CREATININE, CALCIUM,  in the last 72 hours  Studies/Results: Dg Cervical Spine 2-3 Views  10/06/2012  *RADIOLOGY REPORT*  Clinical Data: Post cervical spine fusion.  CERVICAL SPINE - 2-3 VIEW  Comparison: None.  Findings: Two intraoperative lateral views of the neck were obtained.  The bone detail is very limited on this examination. There is evidence for an endotracheal tube and nasogastric tube. The patient has undergone posterior spinal fusion at five levels. Surgical fusion appears to involve C3-C7.  IMPRESSION: Posterior cervical spine fusion as described.   Original Report Authenticated By: Richarda Overlie, M.D.     Assessment/Plan:   Still awaiting transfer to the continue physical occupational therapy  LOS: 2 days     CRAM,GARY P 10/08/2012, 7:25 AM

## 2012-10-09 MED ORDER — HYDROMORPHONE HCL 2 MG PO TABS: 2.0000 mg | ORAL_TABLET | ORAL | Status: DC | PRN

## 2012-10-09 MED ORDER — ALPRAZOLAM 0.5 MG PO TABS
0.5000 mg | ORAL_TABLET | Freq: Once | ORAL | Status: DC
  Filled 2012-10-09: qty 1

## 2012-10-09 MED ORDER — CYCLOBENZAPRINE HCL 10 MG PO TABS: 10.0000 mg | ORAL_TABLET | Freq: Three times a day (TID) | ORAL | Status: AC | PRN

## 2012-10-09 NOTE — Progress Notes (Signed)
HHPT/OT arranged with [facility] per pt's insurance network and choice. Hospital bed and rolling walker also ordered.  Address and home phone listed are correct in EPIC.  A cell phone to be used for hospital bed delivery is [phone].  That is pt's spouse's phone, [name].   Pt expresses concerns that they will not receive another paycheck until 10/21/2012 and is hopeful that they can begin paying copays after that check has arrived.

## 2012-10-09 NOTE — Discharge Summary (Signed)
  Physician Discharge Summary  Patient ID: Anna Lawson MRN: [id number] DOB/AGE: [birth year] 62 y.o.  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 *   Discharged Condition: good  Hospital Course: Patient is better the hospital underwent decompressive cervical laminectomy and fusion postoperatively did very well the ICU just for observation with her cardiac history but did very well with significant improvement preoperative symptoms of numbness and tingling in her hands her incision looked good remain dry to progress immobilize was he'll be discharged home on postop day 3 was scheduled followup approximately 2 weeks  Consults: Significant Diagnostic Studies: Treatments: Posterior cervical decompression and fusion Discharge Exam: Blood pressure 140/72, pulse 69, temperature 97.3 F (36.3 C), temperature source Oral, resp. rate 14, height 4\' 9"  (1.448 m), weight 96.3 kg (212 lb 4.9 oz), SpO2 98.00%. Strength out of 5 wound clean dry  Disposition: Home   Future Appointments Kirra Verga Department Dept Phone   10/27/2012 11:00 AM Charlynne Pander, DDS [facility] [phone]       Medication List    TAKE these medications       aspirin 81 MG tablet  Take 81 mg by mouth daily after breakfast.     BENADRYL PO  Take 1 tablet by mouth every 6 (six) hours as needed. For allergies     cyclobenzaprine 10 MG tablet  Commonly known as:  FLEXERIL  Take 1 tablet (10 mg total) by mouth 3 (three) times daily as needed for muscle spasms.     donepezil 5 MG tablet  Commonly known as:  ARICEPT  Take 10 mg by mouth at bedtime.     donepezil 10 MG tablet  Commonly known as:  ARICEPT  Take 10 mg by mouth at bedtime as needed.     hydrochlorothiazide 25 MG tablet  Commonly known as:   HYDRODIURIL  Take 25 mg by mouth daily after breakfast.     HYDROmorphone 2 MG tablet  Commonly known as:  DILAUDID  Take 1-2 tablets (2-4 mg total) by mouth every 4 (four) hours as needed.     ibuprofen 200 MG tablet  Commonly known as:  ADVIL,MOTRIN  Take 600 mg by mouth every 6 (six) hours as needed for pain.     omeprazole 20 MG tablet  Commonly known as:  PRILOSEC OTC  Take 20 mg by mouth daily as needed. For heartburn     OVER THE COUNTER MEDICATION  Apply 1 application topically daily as needed. For rash/allergies     polyethylene glycol packet  Commonly known as:  MIRALAX / GLYCOLAX  Take 17 g by mouth daily as needed. For constipation     traMADol 50 MG tablet  Commonly known as:  ULTRAM  Take 50 mg by mouth every 6 (six) hours as needed. For pain         Signed: CRAM,GARY P 10/09/2012, 1:31 PM

## 2012-10-09 NOTE — Progress Notes (Signed)
Physical Therapy Treatment Patient Details Name: Anna Lawson MRN: [id number] DOB: August 03, 1950 Today's Date: 10/09/2012 Time: 7829-5621 PT Time Calculation (min): 34 min  PT Assessment / Plan / Recommendation Comments on Treatment Session  62 y.o. female POD #3 posterior cervical spine fusion.   She continues to progress with mobility using the rollator today.  She has decided this is they type of RW she needs due to the fact that she fatigues easily and may need to sit and rest.  Plan is home with husband.     Follow Up Recommendations  Home health PT;Supervision - Intermittent     Does the patient have the potential to tolerate intense rehabilitation    yes  Barriers to Discharge   none      Equipment Recommendations  Hospital bed;Other (comment) (lift chair and rollator)    Recommendations for Other Services   none  Frequency Min 5X/week   Plan Discharge plan remains appropriate;Frequency remains appropriate    Precautions / Restrictions Precautions Precautions: Fall;Cervical Precaution Comments: PTA she did not walk far and often needed the support of shopping cart/husband.  Cervical precautions reviewed with pt.   Required Braces or Orthoses: Cervical Brace Cervical Brace: Soft collar Restrictions Weight Bearing Restrictions: No   Pertinent Vitals/Pain Reports 9/10 neck and left shoulder pain after walk.  RN bringing pain meds at end of session.      Mobility  Bed Mobility Bed Mobility: Sitting - Scoot to Edge of Bed Sitting - Scoot to Edge of Bed: 5: Supervision Transfers Sit to Stand: 5: Supervision;With upper extremity assist;With armrests;From bed;From toilet Stand to Sit: 5: Supervision;With armrests;To chair/3-in-1;With upper extremity assist Stand Pivot Transfers: 5: Supervision;From elevated surface;With armrests Details for Transfer Assistance: supervision for safety due to h/o falls Ambulation/Gait Ambulation/Gait Assistance: 5: Supervision Ambulation  Distance (Feet): 240 Feet Assistive device: Rolling walker (rollator) Ambulation/Gait Assistance Details: Used rollator today due to pt is interested in this for home.  She likes the idea of the seat for when she fatigues she can sit down.  Educated pt re-break use and safety with rollator.  Gait Pattern: Step-through pattern (pt self correcting forward head posture today) Gait velocity: WNL Stairs: No (there are no good stairs in close proximity to ICU.  )      PT Goals Acute Rehab PT Goals Pt will go Sit to Stand: with modified independence PT Goal: Sit to Stand - Progress: Updated due to goal met Pt will go Stand to Sit: with modified independence PT Goal: Stand to Sit - Progress: Updated due to goals met Pt will Transfer Bed to Chair/Chair to Bed: with modified independence PT Transfer Goal: Bed to Chair/Chair to Bed - Progress: Updated due to goal met Pt will Ambulate: with modified independence;with least restrictive assistive device PT Goal: Ambulate - Progress: Updated due to goal met Additional Goals PT Goal: Additional Goal #1 - Progress: Progressing toward goal  Visit Information  Last PT Received On: 10/09/12 Assistance Needed: +1    Subjective Data  Subjective: Pt reports that she is ready to go home.    Cognition  Cognition Overall Cognitive Status: Appears within functional limits for tasks assessed/performed Arousal/Alertness: Awake/alert Orientation Level: Appears intact for tasks assessed Behavior During Session: Anxious Cognition - Other Comments: pt. states she has a difficult time releasing the "control" as she is used to "being on the go". con't. to state she feels like she should be doing more, doing better. jumping around a lot with the conversation  with several conversations required to calm her down and review her progress.        End of Session PT - End of Session Equipment Utilized During Treatment: Cervical collar Activity Tolerance: Patient  limited by fatigue;Patient limited by pain Patient left: in chair;with call bell/phone within reach;with family/visitor present (husband and son/daughter in Social worker)   Lurena Joiner B. Medendorp, PT, DPT [phone]   10/09/2012, 12:04 PM

## 2012-10-09 NOTE — Progress Notes (Signed)
Occupational Therapy Treatment Patient Details Name: Anna Lawson MRN: [id number] DOB: Dec 04, 1950 Today's Date: 10/09/2012 Time: 5284-1324 OT Time Calculation (min): 32 min  OT Assessment / Plan / Recommendation Comments on Treatment Session      Follow Up Recommendations  Home health OT    Barriers to Discharge       Equipment Recommendations  Tub/shower seat    Recommendations for Other Services    Frequency Min 3X/week   Plan Discharge plan remains appropriate    Precautions / Restrictions Precautions Precautions: Fall;Cervical Required Braces or Orthoses: Cervical Brace Cervical Brace: Soft collar   Pertinent Vitals/Pain 4/10 per pt., meds given prior to arrival    ADL  Toilet Transfer: Performed;Supervision/safety Toilet Transfer Method: Sit to Barista: Regular height toilet Toileting - Clothing Manipulation and Hygiene: Independent;Performed Where Assessed - Engineer, mining and Hygiene: Sit to stand from 3-in-1 or toilet Tub/Shower Transfer: Performed;Minimal assistance Tub/Shower Transfer Method: Passenger transport manager: Shower seat with back Equipment Used: Rolling walker Transfers/Ambulation Related to ADLs: min guard a with rw ADL Comments: pt. able to perform sim tub transfer by stepping over object that was same height as tub.  inst. pt. 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 states she has a difficult time releasing the "control" as she is used to "being on the go". con't. to state she feels like she should be doing more, doing better. jumping around a lot with the conversation with several conversations required to calm her down and review her progress.     Mobility  Bed Mobility Bed Mobility: Sitting - Scoot to Edge of Bed Sitting - Scoot to Edge of Bed: 5: Supervision Transfers Transfers: Sit to Stand;Stand to Sit Sit to Stand: 5: Supervision;Without upper extremity assist;From chair/3-in-1 Stand to Sit: 5: Supervision;Without upper extremity assist;To chair/3-in-1    Exercises      Balance     End of Session OT - End of Session Activity Tolerance: Patient tolerated treatment well Patient left: in chair;with family/visitor present  GO     Robet Leu 10/09/2012, 8:20 AM

## 2012-10-09 NOTE — Progress Notes (Addendum)
Nursing 1600 Patient's HR ranging from 104-111, sinus tachycardia.  Dr. Wynetta Emery made aware that patient refused the Xanax and is resting.  Dr. Wynetta Emery is ok with patient continuing to be discharged home.  Patient given discharge instructions and education.  Patient to call with any concerns and follow up with Dr. Wynetta Emery in two weeks.  Prescription given to patient.  Patient tolerating diet, voiding, and ambulation.  Patient passing flatus and instructed on stool softeners and continuing her Miralax at home.  PIV removed and patient discharged to home at 1620.  Patient sent home with home health set up and walker.

## 2012-10-09 NOTE — Progress Notes (Signed)
Subjective: Patient reports She's feeling fine pain is well controlled arms and hands are still improved  Objective: Vital signs in last 24 hours: Temp:  [97.4 F (36.3 C)-98.6 F (37 C)] 98.2 F (36.8 C) (03/14 0436) Pulse Rate:  [60-89] 66 (03/14 0500) Resp:  [13-25] 15 (03/14 0500) BP: (121-143)/(53-101) 121/101 mmHg (03/14 0500) SpO2:  [93 %-97 %] 97 % (03/14 0500)  Intake/Output from previous day: 03/13 0701 - 03/14 0700 In: 1060 [P.O.:1010; IV Piggyback:50] Out: 670 [Urine:600; Drains:70] Intake/Output this shift:    Incision looks good strength is 4+ out of 5  Lab Results: No results found for this basename: WBC, HGB, HCT, PLT,  in the last 72 hours BMET No results found for this basename: NA, K, CL, CO2, GLUCOSE, BUN, CREATININE, CALCIUM,  in the last 72 hours  Studies/Results: No results found.  Assessment/Plan: Continue to mobilize portal discharged later today work on home health needs  LOS: 3 days     CRAM,GARY P 10/09/2012, 7:35 AM

## 2012-10-09 NOTE — Progress Notes (Signed)
Nursing 1530 Patient ready for discharge, but RN assessed and found patient to be tachycardic with HR in the 110s.  RN questioned patient and found that she is very anxious about going home with her new knowledge of her medical needs at home (home health, equipment) and financial concerns related to this.  Dr.Cram made aware.  Order to give patient Xanax 0.5mg  PO and allow to rest before going home.  Patient and patient's husband do not feel that this is necessary.  RN held medication per patient's wishes and will allow patient to rest before going home.  Will continue to assess HR while resting.

## 2012-10-10 ENCOUNTER — Encounter (HOSPITAL_COMMUNITY): Payer: Self-pay | Admitting: Neurosurgery

## 2012-10-16 ENCOUNTER — Ambulatory Visit: Payer: Self-pay | Admitting: Cardiothoracic Surgery

## 2012-10-27 ENCOUNTER — Encounter (HOSPITAL_COMMUNITY): Payer: Self-pay | Admitting: Dentistry

## 2012-11-30 ENCOUNTER — Encounter (HOSPITAL_COMMUNITY): Payer: Self-pay | Admitting: Dentistry

## 2012-11-30 ENCOUNTER — Ambulatory Visit (HOSPITAL_COMMUNITY): Payer: Federal, State, Local not specified - PPO | Admitting: Dentistry

## 2012-11-30 VITALS — BP 127/84 | HR 73 | Temp 98.0°F

## 2012-11-30 DIAGNOSIS — K08109 Complete loss of teeth, unspecified cause, unspecified class: Secondary | ICD-10-CM

## 2012-11-30 DIAGNOSIS — Z463 Encounter for fitting and adjustment of dental prosthetic device: Secondary | ICD-10-CM

## 2012-11-30 DIAGNOSIS — K0820 Unspecified atrophy of edentulous alveolar ridge: Secondary | ICD-10-CM

## 2012-11-30 NOTE — Progress Notes (Signed)
11/30/2012  Patient:            Anna Lawson Date of Birth:  08-05-1950 MRN:                [id number]  BP 127/84  Pulse 73  Temp(Src) 98 F (36.7 C) (Oral)   Anna Lawson presents for start of upper and lower denture fabrication. Patient indicates that she did not proceed with aortic valve replacement with Dr. Donata Clay. Chart review indicates that TEE was performed and it was felt that aortic valve replacement was not needed at this time. Patient subsequently had a cardiac clearance for her neck surgery. Patient underwent cervical laminectomy with cervical neck fusion of C3-C7 with Dr. Donalee Citrin on 10/06/2012. We then rescheduled patient for fabrication of upper lower complete dentures allow for adequate healing. Dr. Wynetta Emery informed patient that she was acceptable for denture fabrication at this time.  Exam: Patient is edentulous. Discussed procedures involved in upper and lower denture fabrication and prognosis for successful ability to wear dentures. Price for dentures confirmed.  Patient did have existing upper complete denture that was fabricated to the previous mandibular occlusion. Patient wished to proceed with fabrication of new upper and lower complete dentures at this time. Procedure:  Upper and lower denture primary impressions in alginate. Lab pour. To [facility] for upper and lower denture custom tray fabrication. RTC for upper and lower denture final impressions.   Charlynne Pander, DDS 11/30/2012

## 2012-11-30 NOTE — Patient Instructions (Signed)
Return to clinic as scheduled for continued fabrication of upper and lower complete dentures. 

## 2012-12-08 ENCOUNTER — Encounter (HOSPITAL_COMMUNITY): Payer: Self-pay | Admitting: Dentistry

## 2012-12-08 ENCOUNTER — Ambulatory Visit (HOSPITAL_COMMUNITY): Payer: Federal, State, Local not specified - PPO | Admitting: Dentistry

## 2012-12-08 VITALS — BP 140/66 | HR 87 | Temp 98.0°F

## 2012-12-08 DIAGNOSIS — K0820 Unspecified atrophy of edentulous alveolar ridge: Secondary | ICD-10-CM

## 2012-12-08 DIAGNOSIS — Z463 Encounter for fitting and adjustment of dental prosthetic device: Secondary | ICD-10-CM

## 2012-12-08 NOTE — Progress Notes (Signed)
12/08/2012  Patient:            Anna Lawson Date of Birth:  01-12-1951 MRN:                [id number]  BP 140/66  Pulse 87  Temp(Src) 98 F (36.7 C) (Oral)  [name] presents for continued upper and lower complete denture fabrication. Procedure:  Upper and lower border molding and final impressions in Aquasil. Patient tolerated procedure well. To [facility] for custom baseplates with rims. Return to clinic for upper and lower complete denture jaw relations.   Charlynne Pander, DDS

## 2012-12-08 NOTE — Patient Instructions (Signed)
Return to clinic as scheduled for continued upper and lower complete denture fabrication. Dr. Kulinski 

## 2012-12-14 ENCOUNTER — Encounter (HOSPITAL_COMMUNITY): Payer: Self-pay | Admitting: Dentistry

## 2012-12-14 ENCOUNTER — Ambulatory Visit (HOSPITAL_COMMUNITY): Payer: Federal, State, Local not specified - PPO | Admitting: Dentistry

## 2012-12-14 VITALS — BP 120/78 | HR 83 | Temp 98.3°F

## 2012-12-14 DIAGNOSIS — Z463 Encounter for fitting and adjustment of dental prosthetic device: Secondary | ICD-10-CM

## 2012-12-14 DIAGNOSIS — K0820 Unspecified atrophy of edentulous alveolar ridge: Secondary | ICD-10-CM

## 2012-12-14 NOTE — Patient Instructions (Signed)
Return to clinic as scheduled for continued upper and lower complete denture fabrication. Ronald F. Kulinski, DDS 

## 2012-12-14 NOTE — Progress Notes (Signed)
12/14/2012  Patient:            Anna Lawson Date of Birth:  Apr 12, 1951 MRN:                [id number]  BP 120/78  Pulse 83  Temp(Src) 98.3 F (36.8 C) (Oral)  Anna Lawson presents for continued denture fabrication. Procedure:  Upper and lower denture Jaw relations with aluwax bite registration. Previous midline of upper complete denture was shifted to the left. Patient agrees to tooth selection of 12E, N, and 10 degree posteriors to match with Portrait A2 shade. Previous denture teeth at 10 mm of length. The set up may have slightly longer tooth (10.80mm) but similar smile line. Patietn accept the set up as discussed. Patient tolerated procedure well. RTC for denture wax try in.  Charlynne Pander, DDS

## 2012-12-23 ENCOUNTER — Ambulatory Visit (HOSPITAL_COMMUNITY): Payer: Federal, State, Local not specified - PPO | Admitting: Dentistry

## 2012-12-23 ENCOUNTER — Encounter (HOSPITAL_COMMUNITY): Payer: Self-pay | Admitting: Dentistry

## 2012-12-23 VITALS — BP 139/90 | HR 98 | Temp 98.3°F

## 2012-12-23 DIAGNOSIS — Z463 Encounter for fitting and adjustment of dental prosthetic device: Secondary | ICD-10-CM

## 2012-12-23 DIAGNOSIS — K0820 Unspecified atrophy of edentulous alveolar ridge: Secondary | ICD-10-CM

## 2012-12-23 NOTE — Progress Notes (Signed)
12/23/2012  Patient:            Anna Lawson Date of Birth:  08-24-1950 MRN:                [id number]  BP 139/90  Pulse 98  Temp(Src) 98.3 F (36.8 C) (Oral)   [name] presents for continued upper and lower denture fabrication. Procedure:  Upper and lower complete denture wax tryin. Patient accepts esthetics, phonetics, fit and function. Patient agrees to process "as is" in [place] [address]. Patient to RTC for  upper and lower denture insertion.  Charlynne Pander, DDS

## 2012-12-23 NOTE — Patient Instructions (Signed)
Return to clinic as scheduled for continued fabrication of upper and lower complete dentures. Anna Lawson, DDS 

## 2012-12-28 DIAGNOSIS — Z0271 Encounter for disability determination: Secondary | ICD-10-CM

## 2012-12-29 ENCOUNTER — Ambulatory Visit (INDEPENDENT_AMBULATORY_CARE_PROVIDER_SITE_OTHER): Payer: BC Managed Care – PPO | Admitting: Neurology

## 2012-12-29 ENCOUNTER — Encounter: Payer: Self-pay | Admitting: Neurology

## 2012-12-29 VITALS — BP 128/86 | HR 94 | Temp 98.8°F | Ht 67.0 in | Wt 209.0 lb

## 2012-12-29 DIAGNOSIS — G3184 Mild cognitive impairment, so stated: Secondary | ICD-10-CM

## 2012-12-29 DIAGNOSIS — M4802 Spinal stenosis, cervical region: Secondary | ICD-10-CM | POA: Insufficient documentation

## 2012-12-29 MED ORDER — DONEPEZIL HCL 10 MG PO TABS: 10.0000 mg | ORAL_TABLET | Freq: Every day | ORAL | Status: DC

## 2012-12-29 NOTE — Progress Notes (Signed)
[facility]  Provider:  Dr Luisdavid Hamblin Referring Provider: Cala Bradford, MD Primary Care Physician:  Cala Bradford, MD  Chief Complaint  Patient presents with  . Follow-up    6 mth, rm 10,MOCCA:27/30    HPI:  Anna Lawson is a 62 y.o. female here as a follow up from recent surgeries .  Anna Lawson  is was originally seen by me with a complaint of memory lapses but had affected her performance at work. 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 She had trouble multitasking and was found to be unable to operate certain machinery as or laboratory instruments but she had been working with for many decades. She also was placed some records or a misplaced the patient's name on the sample. Employer was alarmed and wanted her to be evaluated. This was all a fairly new and development at the time she presented to [place] in June 2013. She also noted at home that she had cooked and Vicodin ingredients in a long time family favored recipe. She became very insecure and at times tearful she also stated that she had been a restless sleeper. The patient became also progressively short of breath at night she underwent memory testing and her am or CEA was 27/30 points and descended 2013 and and an ACE at 30 of 30 AST 18 clock trying for order for as her memory seems to be improving be followed on her complaint of poor sleep quality the patient had a history of edema at the ankle bicuspid aortic stenosis high-grade Mallampati insomnia incontinence possibly some snoring but no witnessed apneas she also developed a gait imbalance and hyperreflexia attributed to cervical spinal stenosis.  On 06/09/2012 A. HST sleep test was performed and showed no significant apnea but prolonged oxygen desaturations as well as an extremely variable heart rate. A pulse oximetry later in December 4 th  had come to  the same conclusion . Her cervical spine was worked up by an imaging study, which led to a urgent  referral to a neurosurgeon. Her high-grade cervical spinal stenosis had formed the spinal cord into an hourglass shape  When she finally prepared for the neurosurgical evaluation and was already in the surgical suite, the anesthesiologist took her history and stumbled over her bicuspid stenosis.  He initiated a echocardiogram and then returned the patient was referred to Dr. Morton Peters   for a possible valve replacement. She was actually told that his valve replacement was eminent, in preparation for the cardiac surgery teeth were pulled and when she was ready to undergo the surgery Dr. Zenaida Niece Tright endoscopically evaluated her further  : Just before surgery he than stated, the surgery is not necessary for the valve replacement was not necessary and that she should instead undergo the cervical spinal stenosis ASAP. This was finally done in October 06, 2012   more than 3 months after her referral.  The patient presents today with a neck collar. Her recovery from surgery is in good progress, she saw Dr. Park Breed today ordered an MRI of the cervical spine and CT study of the shoulder the patient has right-sided shoulder pain with elevation of the right upper extremity none of these symptoms occur on the left. She was allowed to start taking her collar off for several hours a day until she will be condition her neck musculature.  Today MOCA test is scored 27/30 points and her work fluency of 20 points.  The patient endorses high  deep degree of fatigue but again her heart condition has not been addressed at she and her is the Epworth sleepiness score at 5 points only.     Review of Systems: Out of a complete 14 system review, the patient complains of only the following symptoms, and all other reviewed systems are negative. SOB .  History   Social History  . Marital Status: Married    Spouse Name: N/A    Number of Children: 3  . Years of Education: N/A   Occupational History  . LPN    Social History  Main Topics  . Smoking status: Former Smoker -- 1.00 packs/day for 35 years    Types: Cigarettes    Quit date: 07/29/2002  . Smokeless tobacco: Never Used     Comment: occ alcohol  . Alcohol Use: Yes     Comment: occasional  . Drug Use: No  . Sexually Active: Not on file   Other Topics Concern  . Not on file   Social History Narrative  . No narrative on file    Family History  Problem Relation Age of Onset  . Emphysema Maternal Grandmother     never smoked  . Emphysema Paternal Uncle     never smoked  . Heart disease Mother     AGE.Marland KitchenMarland KitchenIN HER 50'S  . Diabetes Mother     TYPE 1  . Bladder Cancer Father   . Diabetes Brother     TYPE I    Past Medical History  Diagnosis Date  . Spinal stenosis   . Hyperlipidemia   . Aortic stenosis     bicuspid AV, mid to mod AR, mod AS by 09/17/11 echo [facility])  . Shortness of breath     all time  . Ankle edema     takes HCTZ, both ankles  . Constipation   . History of blood transfusion 40 yrs ago  . Pre-operative cardiovascular examination   . Dementia     a little  . Heart murmur   . Asthma     denies  . GERD (gastroesophageal reflux disease)   . Neuromuscular disorder     muscle cramps in legs from lipid med    Past Surgical History  Procedure Laterality Date  . Cardiac catheterization  08-21-2012    03/26/05 [facility]): normal coronaries, normal LV function, mild AS/AI.  No MR/MS.  Normal PA pressures and hemodynamics.  . Multiple extractions with alveoloplasty N/A 09/15/2012    Procedure: MULTIPLE EXTRACTION WITH ALVEOLOPLASTY/PRE PROSTESTIC;  Surgeon: Charlynne Pander, DDS;  Location: [facility] ORS;  Service: Oral Surgery;  Laterality: N/A;  Extraction of tooth #9, [phone] with alveoloplasty and bilateral mandibular tori reductions  . Intraoperative transesophageal echocardiogram N/A 09/24/2012    Procedure: INTRAOPERATIVE TRANSESOPHAGEAL ECHOCARDIOGRAM;  Surgeon: Kerin Perna, MD;   Location: [facility] OR;  Service: Open Heart Surgery;  Laterality: N/A;  . Posterior cervical fusion/foraminotomy N/A 10/06/2012    Procedure: POSTERIOR CERVICAL FUSION/FORAMINOTOMY LEVEL 4;  Surgeon: Mariam Dollar, MD;  Location: [facility] ORS;  Service: Neurosurgery;  Laterality: N/A;  cervical three-seven    Current Outpatient Prescriptions  Medication Sig Dispense Refill  . aspirin 81 MG tablet Take 81 mg by mouth daily after breakfast.       . cyclobenzaprine (FLEXERIL) 10 MG tablet Take 1 tablet (10 mg total) by mouth 3 (three) times daily as needed for muscle spasms.  80 tablet  0  . DiphenhydrAMINE HCl (BENADRYL PO) Take 1 tablet  by mouth every 6 (six) hours as needed. For allergies      . donepezil (ARICEPT) 5 MG tablet Take 10 mg by mouth at bedtime.       . hydrochlorothiazide (HYDRODIURIL) 25 MG tablet Take 25 mg by mouth daily after breakfast.       . HYDROmorphone (DILAUDID) 2 MG tablet Take 1-2 tablets (2-4 mg total) by mouth every 4 (four) hours as needed.  80 tablet  0  . ibuprofen (ADVIL,MOTRIN) 200 MG tablet Take 600 mg by mouth every 6 (six) hours as needed for pain.       Marland Kitchen omeprazole (PRILOSEC OTC) 20 MG tablet Take 20 mg by mouth daily as needed. For heartburn      . OVER THE COUNTER MEDICATION Apply 1 application topically daily as needed. For rash/allergies      . polyethylene glycol (MIRALAX / GLYCOLAX) packet Take 17 g by mouth daily as needed. For constipation      . traMADol (ULTRAM) 50 MG tablet Take 50 mg by mouth every 6 (six) hours as needed. For pain       No current facility-administered medications for this visit.    Allergies as of 12/29/2012 - Review Complete 12/29/2012  Allergen Reaction Noted  . Adhesive (tape)  07/06/2012  . Codeine Nausea And Vomiting 07/06/2012    Vitals: BP 128/86  Pulse 94  Temp(Src) 98.8 F (37.1 C) (Oral)  Ht 5\' 7"  (1.702 m)  Wt 209 lb (94.802 kg)  BMI 32.73 kg/m2 Last Weight:  Wt Readings from Last 1 Encounters:  12/29/12 209  lb (94.802 kg)   Last Height:   Ht Readings from Last 1 Encounters:  12/29/12 5\' 7"  (1.702 m)    Physical exam:  General: The patient is awake, alert and appears not in acute distress. The patient is well groomed. Head: Normocephalic, atraumatic. Neck is supple. Mallampati 2, neck circumference:16.5 inches . Cardiovascular:  Regular rate and rhythm with a aortic murmurs or carotid bruit, and without distended neck veins. Respiratory: Lungs are clear to auscultation. Skin:  Without evidence of edema, or rash Trunk: BMI is indicating obesity,  elevated and patient  has normal posture.  Neurologic exam : The patient is awake and alert, oriented to place and time.  Memory subjective  described as impaired under stress, the patient and her family have noticed that she still is anxious but not finding words for example. She has not gotten lost, does no longer drive,  not have further memory concerned to restrict her driving as less she is able again to operate a vehicle. There is a normal attention span & concentration ability. Speech is fluent without dysarthria, dysphonia or aphasia.  Mood and affect are appropriate.  Cranial nerves: Pupils are equal and briskly reactive to light. Funduscopic exam without  evidence of pallor or edema. Extraocular movements  in vertical and horizontal planes intact and without nystagmus. Visual fields by finger perimetry are intact. Hearing to finger rub intact.  Facial sensation intact to fine touch. Facial motor strength is symmetric and tongue and uvula move midline.  Motor exam:   Normal tone and normal muscle bulk and symmetric normal strength in all extremities.  Sensory:  Fine touch, pinprick and vibration were tested in all extremities. Proprioception is tested in the upper extremities only. This was  normal.  Coordination: Rapid alternating movements in the fingers/hands is tested and only on hte left side  normal. Finger-to-nose maneuver tested and  normal without evidence of  ataxia, dysmetria or tremor.  Gait and station: Patient walks with cane  As  assistive device. Strength is still reduced , legs and right arm,  Stance is stable and normal. Tandem gait is fragmented.   Deep tendon reflexes: in the  upper and lower extremities are brisk,  2 beat clonus left over right .   Assessment:  After physical and neurologic examination, review of laboratory studies, imaging, neurophysiology testing and pre-existing records, assessment will be reviewed on the problem list.    1# This patient is expected to heal from her spinal stenosis surgery over the next 2 years, at that time she would be 6 month short of her 64th birthday. 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 She will have a lifelong higher risk of suffering severe cervical cord damage if she would fall.  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 However it does affect her stamina and I think is at the baseline for some of her fatigue. Please note that the patient had hypoxemia in a overnight pulse oximetry and home sleep test in December 2013.   #3 the patient's memory loss seems to have improved or is at least stable. I will continue her on Aricept. I believe that all 3 conditions together make it impossible for this patient to return to gainful employment.  Plan:  Treatment plan and additional workup will be reviewed under Problem List.

## 2012-12-29 NOTE — Patient Instructions (Signed)
Alzheimer's Disease Alzheimer's disease is a breaking down of the brain that sometimes happens with aging. It often affects memory, thinking, and speaking. It also affects how you get along with people and job performance. Often the changes come on slowly and are not very noticeable. The changes may be silent and hidden for a long time, even from family and friends. Mood and personality changes often cause the family to seek medical care. CAUSES  Alzheimer's disease is the leading cause of dementia. Dementia is a reduced ability of the working of the brain. Alzheimer's is one type of dementia and is caused by small changes in the brain. There are many causes of Alzheimer's disease. DIAGNOSIS  There are no specific tests for Alzheimer's disease, other than doing a biopsy(tissue sample) of the brain. This is not usually done. Physical examination and history often provide the best clues for a diagnosis. Often the diagnosis may be made over time, with more observation.  TREATMENT  There is no specific treatment for Alzheimer's disease. Your caregivers will discuss the particular problems you are dealing with or may deal with. They can help direct you to other caregivers who can help in the care of Alzheimer's patients.  Document Released: 04/24/2005 Document Revised: 10/07/2011 Document Reviewed: 12/15/2006 [employer] Patient Information 2014 [employer].  

## 2012-12-31 ENCOUNTER — Other Ambulatory Visit: Payer: Self-pay | Admitting: Neurosurgery

## 2012-12-31 ENCOUNTER — Encounter (HOSPITAL_COMMUNITY): Payer: Self-pay | Admitting: Dentistry

## 2012-12-31 ENCOUNTER — Ambulatory Visit (HOSPITAL_COMMUNITY): Payer: Federal, State, Local not specified - PPO | Admitting: Dentistry

## 2012-12-31 VITALS — BP 114/63 | HR 89 | Temp 99.1°F

## 2012-12-31 DIAGNOSIS — M25511 Pain in right shoulder: Secondary | ICD-10-CM

## 2012-12-31 DIAGNOSIS — K08109 Complete loss of teeth, unspecified cause, unspecified class: Secondary | ICD-10-CM

## 2012-12-31 DIAGNOSIS — K0820 Unspecified atrophy of edentulous alveolar ridge: Secondary | ICD-10-CM

## 2012-12-31 DIAGNOSIS — Z463 Encounter for fitting and adjustment of dental prosthetic device: Secondary | ICD-10-CM

## 2012-12-31 DIAGNOSIS — G959 Disease of spinal cord, unspecified: Secondary | ICD-10-CM

## 2012-12-31 NOTE — Progress Notes (Signed)
12/31/2012  Patient:            Anna Lawson Date of Birth:  12-01-1950 MRN:                [id number]  BP 114/63  Pulse 89  Temp(Src) 99.1 F (37.3 C) (Oral)  [name] presents for insertion of upper and lower complete dentures. Procedure: Pressure indicating paste applied to dentures. Adjustments made as needed. [place]. Occlusion evaluated and adjustments made as needed for Centric Relation and protrusive strokes. Good esthetics, phonetics, fit, and function noted. Patient accepts results. Post op instructions provided in written and verbal formats on use and care of dentures. Gave patient denture brush and cup. Patient to keep dentures out if sore spots develop. Use salt water rinses as needed to aid healing. Return to clinic as scheduled for denture adjustment.  Call if problems arise before then. Patient dismissed in stable condition.  Charlynne Pander, DDS

## 2012-12-31 NOTE — Patient Instructions (Signed)
Instructions for Denture Use and Care  Congratulations, you are on the way to oral rehabilitation!  You have just received a new set of complete or partial dentures.  These prostheses will help to improve both your appearance and chewing ability.  These instructions will help you get adjusted to your dentures as well as care for them properly.  Please read these instructions carefully and completely as soon as you get home.  If you or your caregiver have any questions please notify the [facility] at [phone].  HOW YOUR DENTURES LOOK AND FEEL Soon after you begin wearing your dentures, you may feel that your dentures are too large or even loose.  As our mouth and facial muscles become accustomed to the dentures, these feelings will go away.  You also may feel that you are salivating more than you normally do.  This feeling should go away as you get used to having the dentures in your mouth.  You may bite your cheek or your tongue; this will eventually resolve itself as you wear your dentures.  Some soreness is to be expected, but you should not hurt.  If your mouth hurts, call your dentist.  A denture adhesive may occasionally be necessary to hold your dentures in place more securely.  The dentist will let you know when one is recommended for you.  SPEAKING Wearing dentures will change the sound of your voice initially.  This will be noticed by you more than anyone else.  Bite and swallow before you speak, in order to place your dentures in position so that you may speak more clearly.  Practice speaking by reading aloud or counting from 1 to 100 very slowly and distinctly.  After some practice your mouth will become accustomed to your dentures and you will speak more clearly.  EATING Chewing will definitely be different after you receive your dentures.  With a little practice and patience you should be able to eat just about any kind of food.  Begin by eating small quantities of food  that are cut into small pieces.  Star with soft foods such as eggs, cooked vegetables, or puddings.  As you gain confidence advance  Your diet to whatever texture foods you can tolerate.  DENTURE CARE Dentures can collect plaque and calculus much the same as natural teeth can.  If not removed on a regular basis, your dentures will not look or feel clean, and you will experience denture odor.  It is very important that you remove your dentures at bedtime and clean them thoroughly.  You should: 1. Clean your dentures over a sink full of water so if dropped, breakage will be prevented. 2. Rinse your dentures with cool water to remove any large food particles. 3. Use soap and water or a denture cleanser or paste to clean the dentures.  Do not use regular toothpaste as it may abrade the denture base or teeth. 4. Use a moistened denture brush to clean all surfaces (inside and outside). 5. Rinse thoroughly to remove any remaining soap or denture cleanser. 6. Use a soft bristle toothbrush to gently brush any natural teeth, gums, tongue, and palate at bedtime and before reinserting your dentures. 7. Do not sleep with your dentures in your mouth at night.  Remove your dentures and soak them overnight in a denture cup filled with water or denture solution as recommended by your dentist.  This routine will become second nature and will increase the life and comfort   of your dentures.  Please do not try to adjust these dentures yourself; you could damage them.  FOLLOW-UP You should call or make an appointment with your dentist.  Your dentist would like to see you at least once a year for a check-up and examination. 

## 2013-01-04 ENCOUNTER — Encounter (HOSPITAL_COMMUNITY): Payer: Self-pay | Admitting: Dentistry

## 2013-01-04 ENCOUNTER — Ambulatory Visit (HOSPITAL_COMMUNITY): Payer: Federal, State, Local not specified - PPO | Admitting: Dentistry

## 2013-01-04 VITALS — BP 127/67 | HR 83 | Temp 98.3°F

## 2013-01-04 DIAGNOSIS — K1370 Unspecified lesions of oral mucosa: Secondary | ICD-10-CM

## 2013-01-04 DIAGNOSIS — Z463 Encounter for fitting and adjustment of dental prosthetic device: Secondary | ICD-10-CM

## 2013-01-04 DIAGNOSIS — K0820 Unspecified atrophy of edentulous alveolar ridge: Secondary | ICD-10-CM

## 2013-01-04 DIAGNOSIS — K062 Gingival and edentulous alveolar ridge lesions associated with trauma: Secondary | ICD-10-CM

## 2013-01-04 DIAGNOSIS — K08109 Complete loss of teeth, unspecified cause, unspecified class: Secondary | ICD-10-CM

## 2013-01-04 NOTE — Progress Notes (Signed)
01/04/2013  Patient:            Anna Lawson Date of Birth:  12/15/1950 MRN:                [id number]  BP 127/67  Pulse 83  Temp(Src) 98.3 F (36.8 C) (Oral)  [name] presents for evaluation of recently inserted upper and lower complete dentures. SUBJECTIVE: Patient is complaining of denture irritation to several sites and the mandibular anterior area and lower left and lower right lingual areas. OBJECTIVE: There is some denture irritation noted involving the mandibular anterior area buccal extension in the area of tooth numbers 23 and 26 as well as the lingual aspect of tooth numbers 20 and 29. No obvious denture irritation noted involving the upper soft tissues.  Procedure: Pressure indicating paste applied to dentures. Adjustments made as needed.  Take pressure indicating paste was applied to borders and borders were adjusted as needed. Upper lower complete dentures were polished.  Occlusion evaluated and adjustments made as needed for Centric Relation and protrusive strokes. Patient accepts results. Patient to keep dentures out as much as possible to allow for the sore spots to heal. Patient is to use salt water rinses every 2 hours while awake to aid healing. Patient to slowly start using the dentures again over the next 48 hours. Patient is leaving for vacation and will return on 01/17/2013. Patient is scheduled for followup visit on 01/20/2013 per her request. Call if problems arise before then or if appointment is wanted sooner than 01/20/2013. Patient dismissed in stable condition.  Charlynne Pander, DDS

## 2013-01-04 NOTE — Patient Instructions (Addendum)
Patient to keep dentures out as much as possible to allow for the sore spots to heal. Patient is to use salt water rinses every 2 hours while awake to aid healing. Patient to slowly start using the dentures again over the next 48 hours. Patient is leaving for vacation and will return on 01/17/2013. Patient is scheduled for followup visit on 01/20/2013 per her request. Call if problems arise before then or if appointment is wanted sooner than 01/20/2013. Dr. Kristin Bruins

## 2013-01-19 ENCOUNTER — Ambulatory Visit
Admission: RE | Admit: 2013-01-19 | Discharge: 2013-01-19 | Disposition: A | Discharge status: Home / Self Care | Payer: Federal, State, Local not specified - PPO | Source: Ambulatory Visit | Attending: Neurosurgery | Admitting: Neurosurgery

## 2013-01-19 DIAGNOSIS — M25511 Pain in right shoulder: Secondary | ICD-10-CM

## 2013-01-19 DIAGNOSIS — G959 Disease of spinal cord, unspecified: Secondary | ICD-10-CM

## 2013-01-26 ENCOUNTER — Other Ambulatory Visit: Payer: Self-pay | Admitting: Neurosurgery

## 2013-01-26 DIAGNOSIS — G959 Disease of spinal cord, unspecified: Secondary | ICD-10-CM

## 2013-02-05 ENCOUNTER — Ambulatory Visit
Admission: RE | Admit: 2013-02-05 | Discharge: 2013-02-05 | Disposition: A | Discharge status: Home / Self Care | Payer: Federal, State, Local not specified - PPO | Source: Ambulatory Visit | Attending: Neurosurgery | Admitting: Neurosurgery

## 2013-02-05 DIAGNOSIS — G959 Disease of spinal cord, unspecified: Secondary | ICD-10-CM

## 2013-02-05 MED ORDER — GADOBENATE DIMEGLUMINE 529 MG/ML IV SOLN
19.0000 mL | Freq: Once | INTRAVENOUS | Status: AC | PRN
  Administered 2013-02-05: 19 mL via INTRAVENOUS

## 2013-02-08 ENCOUNTER — Encounter (HOSPITAL_COMMUNITY): Payer: Self-pay | Admitting: Dentistry

## 2013-02-08 ENCOUNTER — Ambulatory Visit (HOSPITAL_COMMUNITY): Payer: Federal, State, Local not specified - PPO | Admitting: Dentistry

## 2013-02-08 VITALS — BP 117/76 | HR 82 | Temp 98.4°F

## 2013-02-08 DIAGNOSIS — Z463 Encounter for fitting and adjustment of dental prosthetic device: Secondary | ICD-10-CM

## 2013-02-08 DIAGNOSIS — K08109 Complete loss of teeth, unspecified cause, unspecified class: Secondary | ICD-10-CM

## 2013-02-08 DIAGNOSIS — K062 Gingival and edentulous alveolar ridge lesions associated with trauma: Secondary | ICD-10-CM

## 2013-02-08 DIAGNOSIS — K1370 Unspecified lesions of oral mucosa: Secondary | ICD-10-CM

## 2013-02-08 NOTE — Patient Instructions (Signed)
Patient is to return to clinic for denture adjustment as scheduled.  Patient to call if problems arise before then.  Patient is to use salt water rinses every 2 hours while awake to aid healing if sore spots arise. Dr. Kristin Bruins

## 2013-02-08 NOTE — Progress Notes (Signed)
  02/08/2013  Patient:            Anna Lawson Date of Birth:  1950/08/28 MRN:                [id number]  BP 117/76  Pulse 82  Temp(Src) 98.4 F (36.9 C) (Oral)  [name] presents for evaluation of recently inserted upper and lower complete dentures. SUBJECTIVE: Patient is complaining of denture irritation to the maxillary left posterior extension and the lower alveolar ridge. Patient is "eating corn on the cob, but steak is still a little hard". OBJECTIVE: There is NO sign of denture irritation or erythema.  Procedure: Pressure indicating paste applied to dentures. Adjustments made as needed.  Upper and lower complete dentures were polished.  Occlusion evaluated and no adjustments were needed. Patient accepts results. Patient is to return to clinic for denture adjustment as scheduled.  Patient to call if problems arise before then.  Patient is to use salt water rinses every 2 hours while awake to aid healing if sore spots arise.  Patient dismissed in stable condition.  Charlynne Pander, DDS

## 2013-03-08 DIAGNOSIS — Z0289 Encounter for other administrative examinations: Secondary | ICD-10-CM

## 2013-03-15 ENCOUNTER — Other Ambulatory Visit: Payer: Self-pay | Admitting: Family Medicine

## 2013-03-15 ENCOUNTER — Other Ambulatory Visit (HOSPITAL_COMMUNITY)
Admission: RE | Admit: 2013-03-15 | Discharge: 2013-03-15 | Disposition: A | Discharge status: Home / Self Care | Payer: Federal, State, Local not specified - PPO | Source: Ambulatory Visit | Attending: Family Medicine | Admitting: Family Medicine

## 2013-03-15 DIAGNOSIS — Z124 Encounter for screening for malignant neoplasm of cervix: Secondary | ICD-10-CM | POA: Insufficient documentation

## 2013-03-16 ENCOUNTER — Encounter (HOSPITAL_COMMUNITY): Payer: Self-pay | Admitting: Dentistry

## 2013-04-01 ENCOUNTER — Other Ambulatory Visit: Payer: Self-pay | Admitting: Nurse Practitioner

## 2014-01-12 IMAGING — RF DG SNIFF TEST
6 series · 15 of 24 positions shown · non-contrast
Comparison: None.

CLINICAL DATA: Short of breath

SNIFF TEST

[Series 1: run · 2 of 11 slices shown (1 of 6)]
[im 1/11]
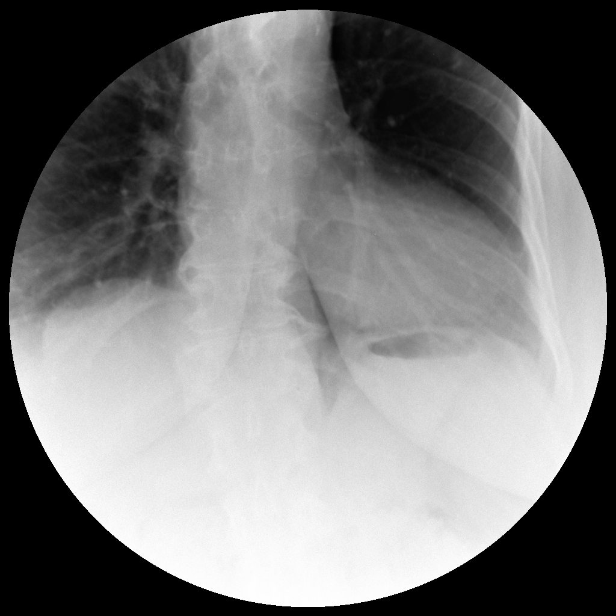
[im 11/11]
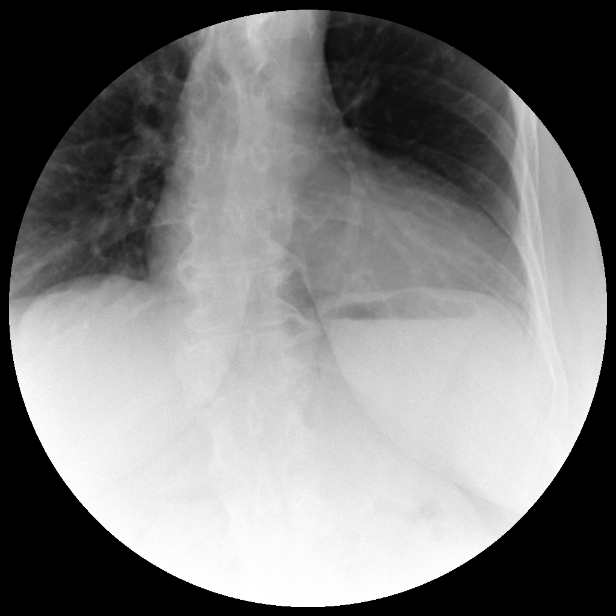

[Series 1: run · 2 of 11 slices shown (2 of 6)]
[im 6/11]
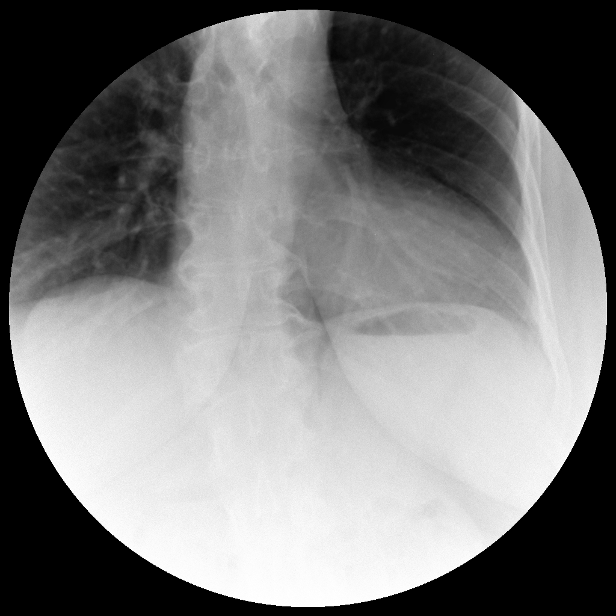
[im 11/11]
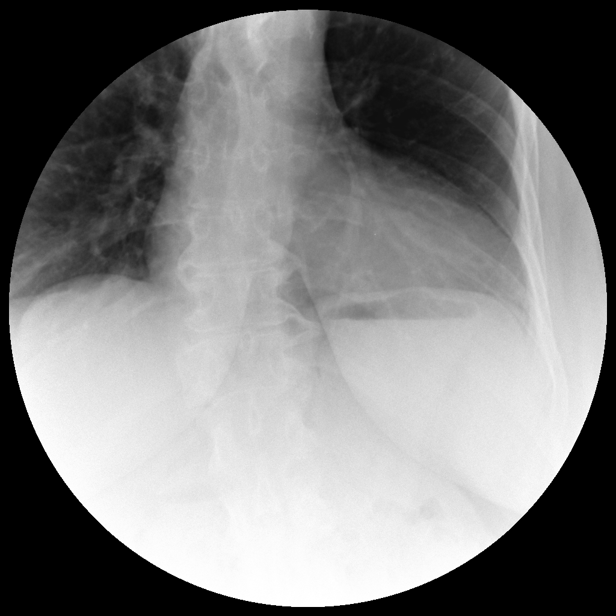

[Series 2: run · 3 of 17 slices shown (3 of 6)]
[im 5/17]
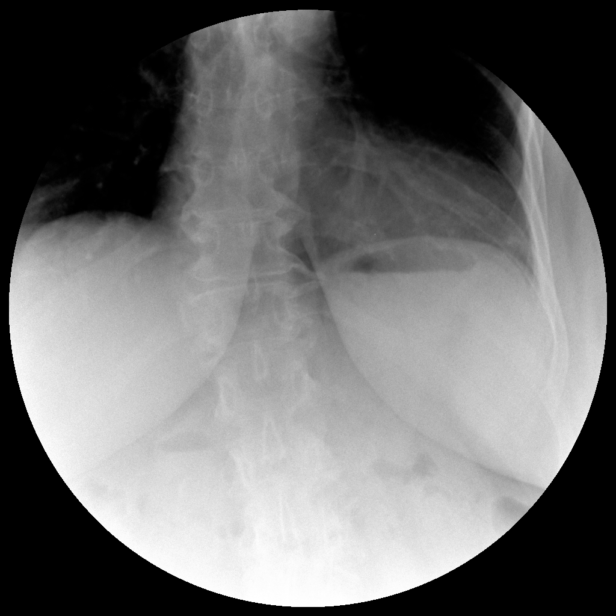
[im 9/17]
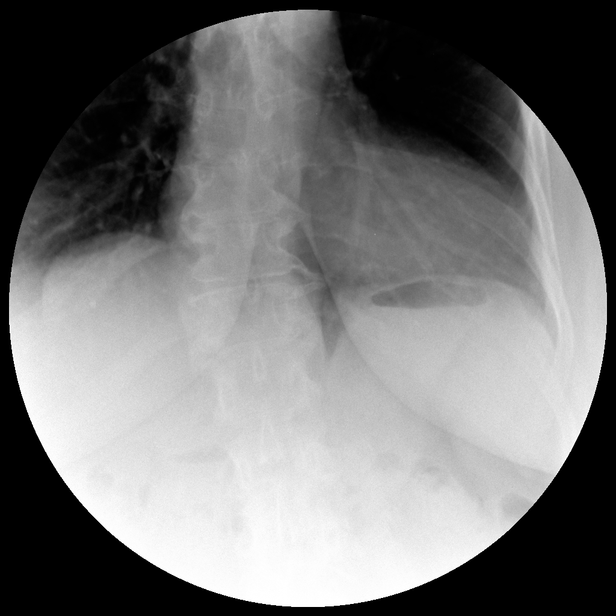
[im 17/17]
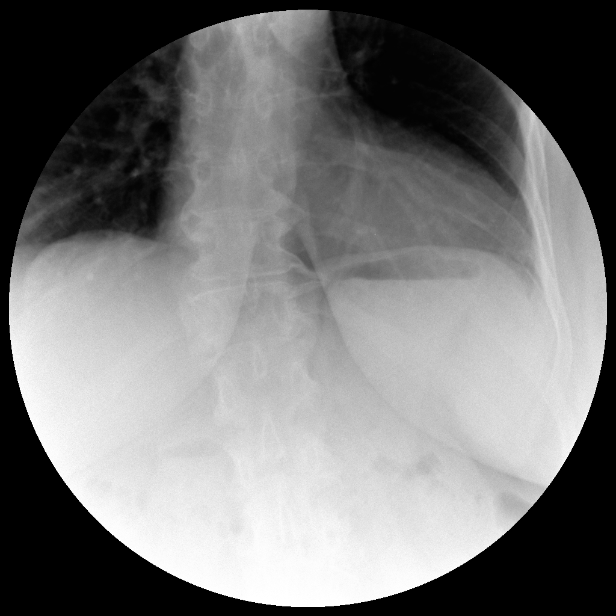

[Series 2: run · 3 of 17 slices shown (4 of 6)]
[im 5/17]
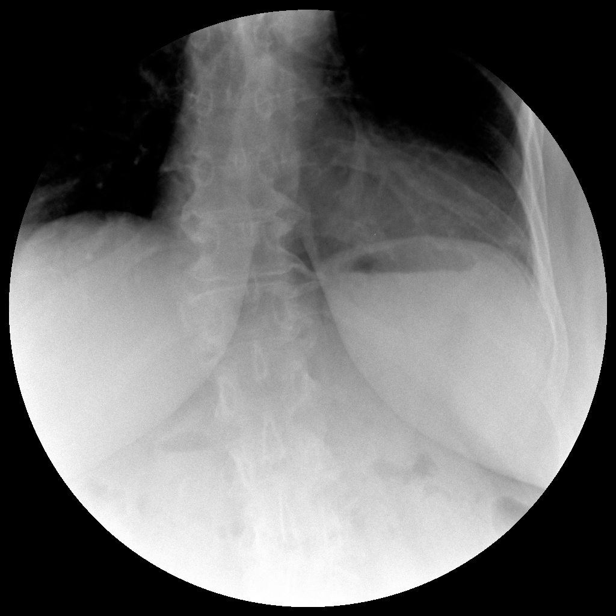
[im 9/17]
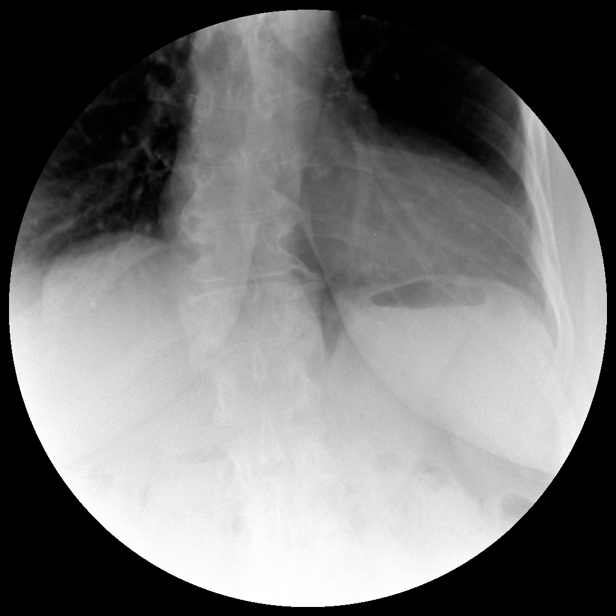
[im 17/17]
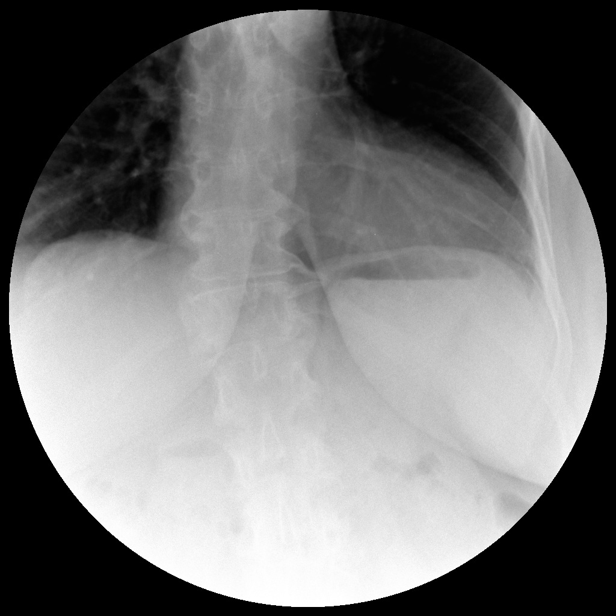

[Series 3: run · 2 of 16 slices shown (5 of 6)]
[im 1/16]
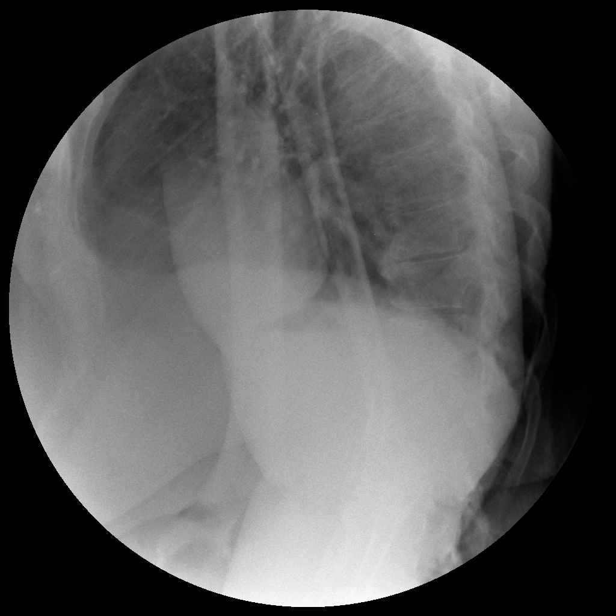
[im 11/16]
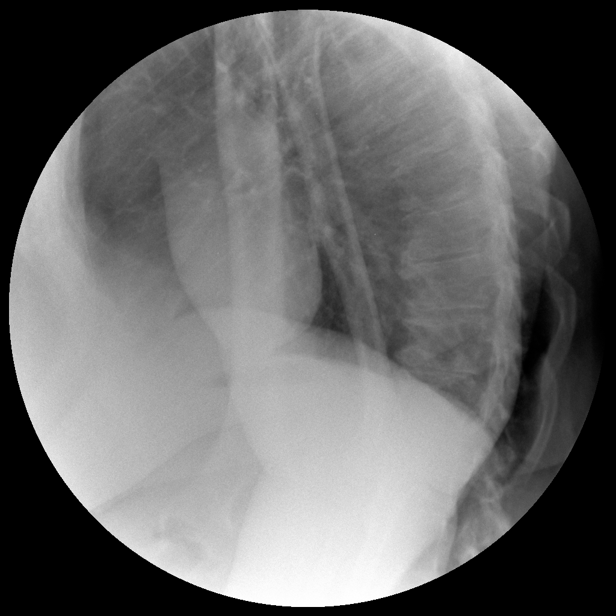

[Series 3: run · 3 of 16 slices shown (6 of 6)]
[im 1/16]
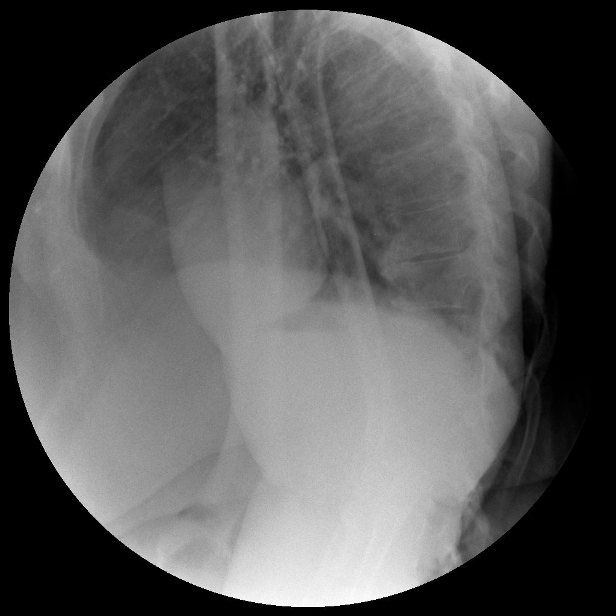
[im 6/16]
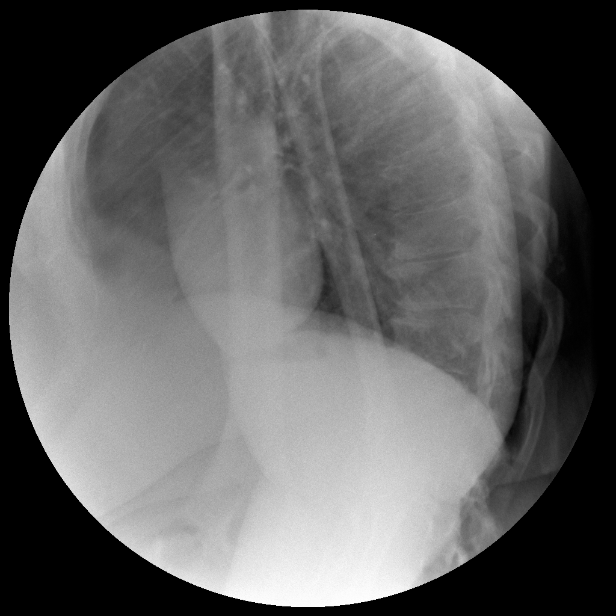
[im 16/16]
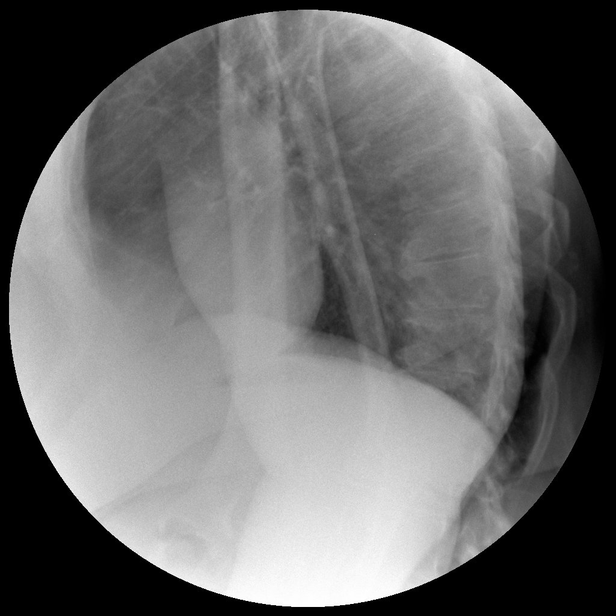

[15 of 24 positions shown; findings below may reference images not displayed]

FINDINGS: Under fluoroscopic observation, the patient was
instructed to inhale forcefully through  her nose.  The diaphragms
have symmetric downward movement with a inhalation.  No evidence of
diaphragmatic paralyzation.
IMPRESSION: Symmetric diaphragmatic movement with forceful inhalation.

## 2014-02-13 IMAGING — CR DG CHEST 2V
2 series · 2 of 2 positions shown · non-contrast
Comparison: None.

CLINICAL DATA: Preop for cervical spine fusion, short of breath,
former smoking history

CHEST - 2 VIEW

[view not recorded (1 of 2)]
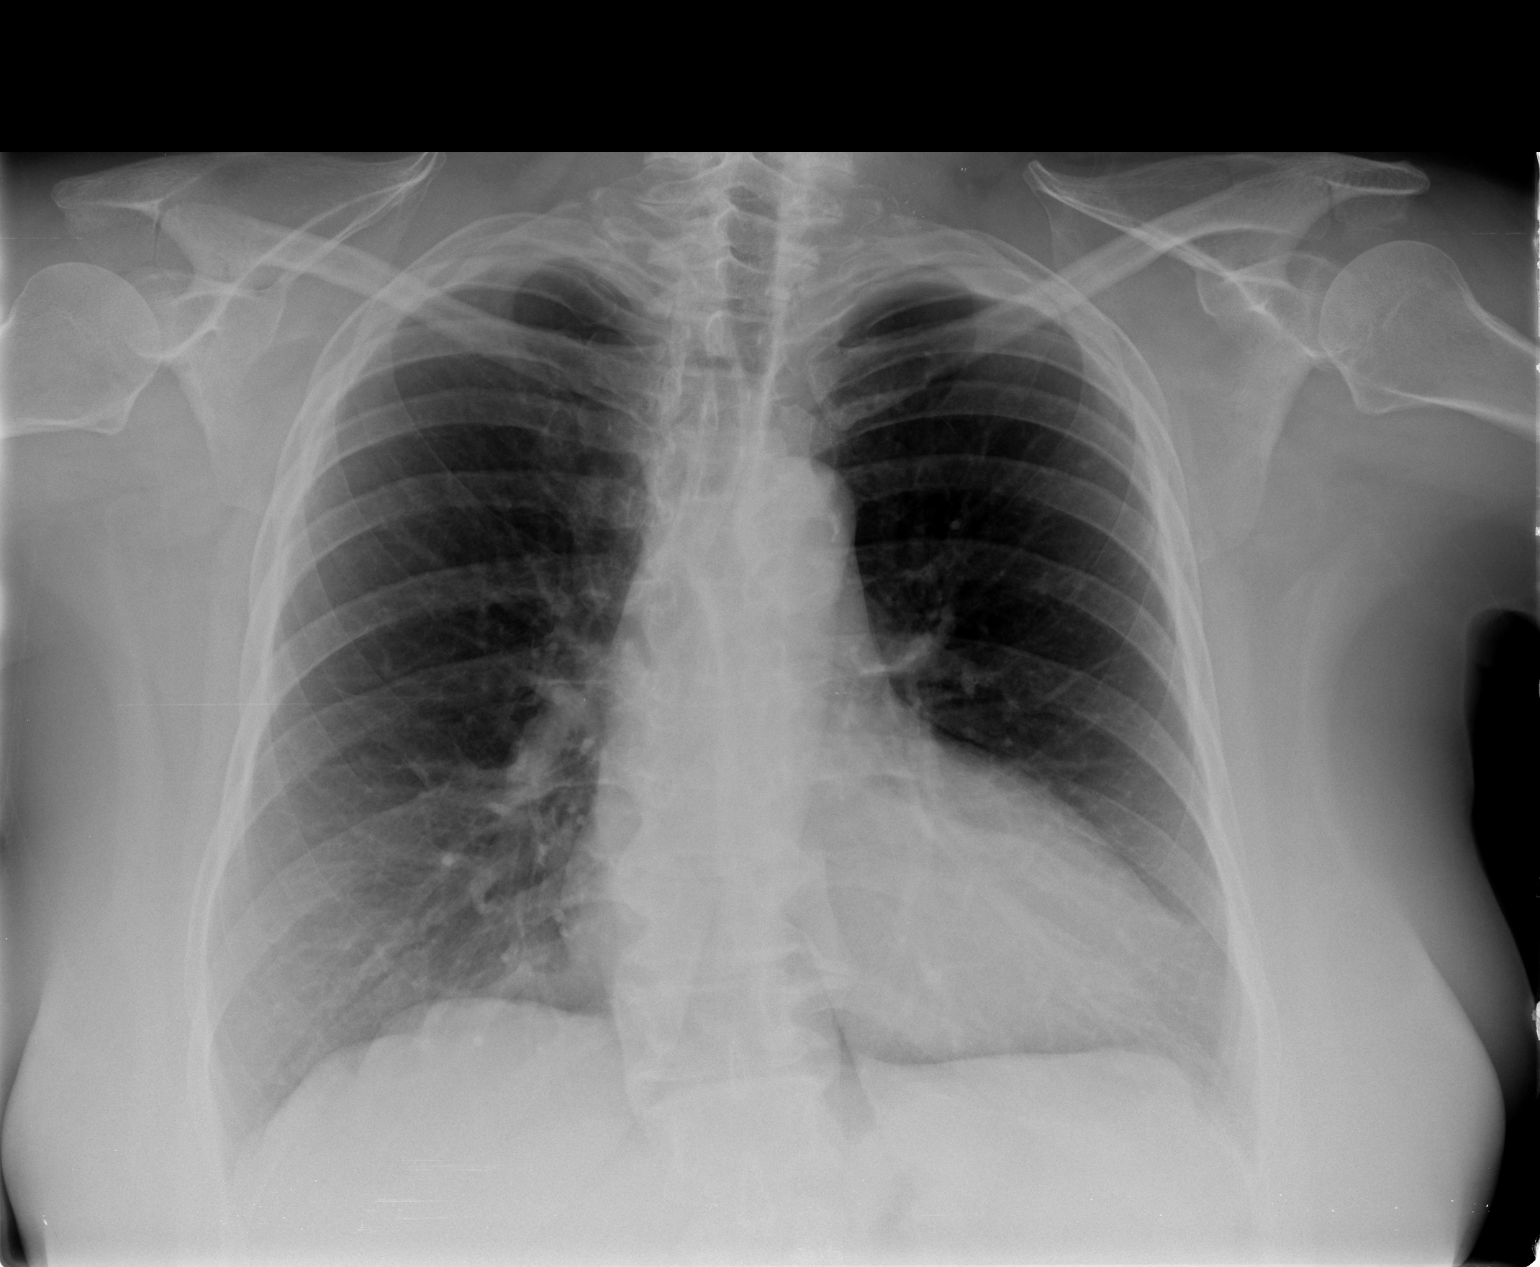

[view not recorded (2 of 2)]
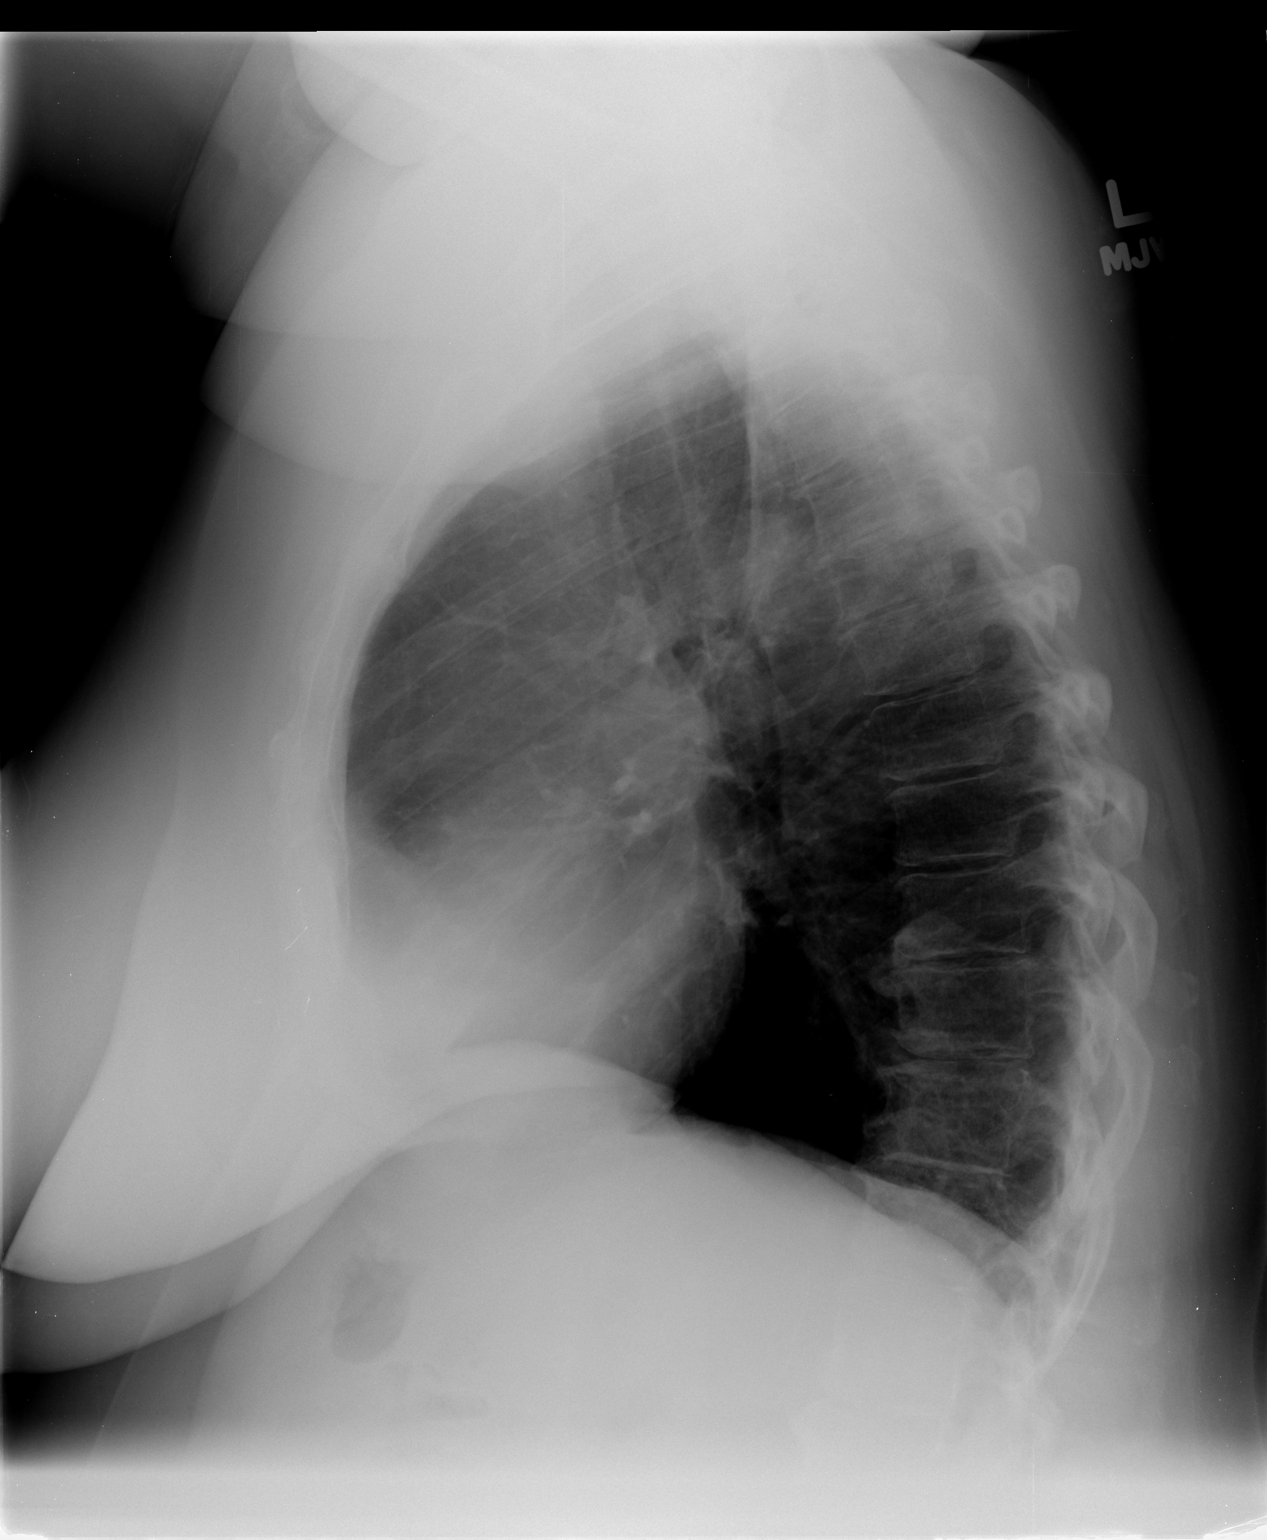

[2 of 2 positions shown; findings below may reference images not displayed]

FINDINGS: No active infiltrate or effusion is seen.  The lungs are
slightly hyperaerated.  There is cardiomegaly present.  There are
degenerative changes throughout the thoracic spine, with a mild
thoracic curvature convex to the right.
IMPRESSION: Cardiomegaly.  No active lung disease.  Slight hyperaeration.

## 2014-03-18 ENCOUNTER — Other Ambulatory Visit: Payer: Self-pay | Admitting: Neurology

## 2014-04-14 IMAGING — RF DG CERVICAL SPINE 2 OR 3 VIEWS
1 series · 2 of 2 positions shown · non-contrast
Comparison: None.

CLINICAL DATA: Post cervical spine fusion.

CERVICAL SPINE - 2-3 VIEW

[Series 1: run · 2 of 2 slices shown]
[im 1/2]
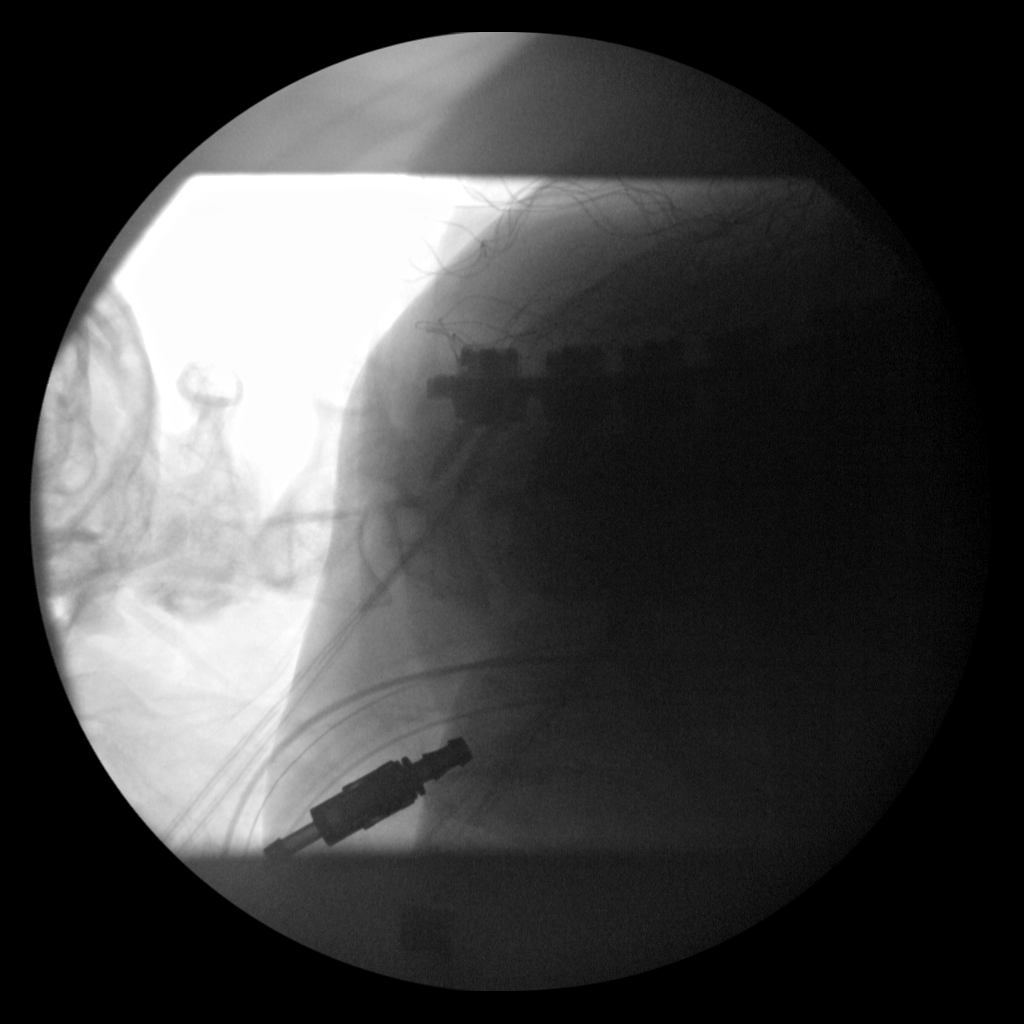
[im 2/2]
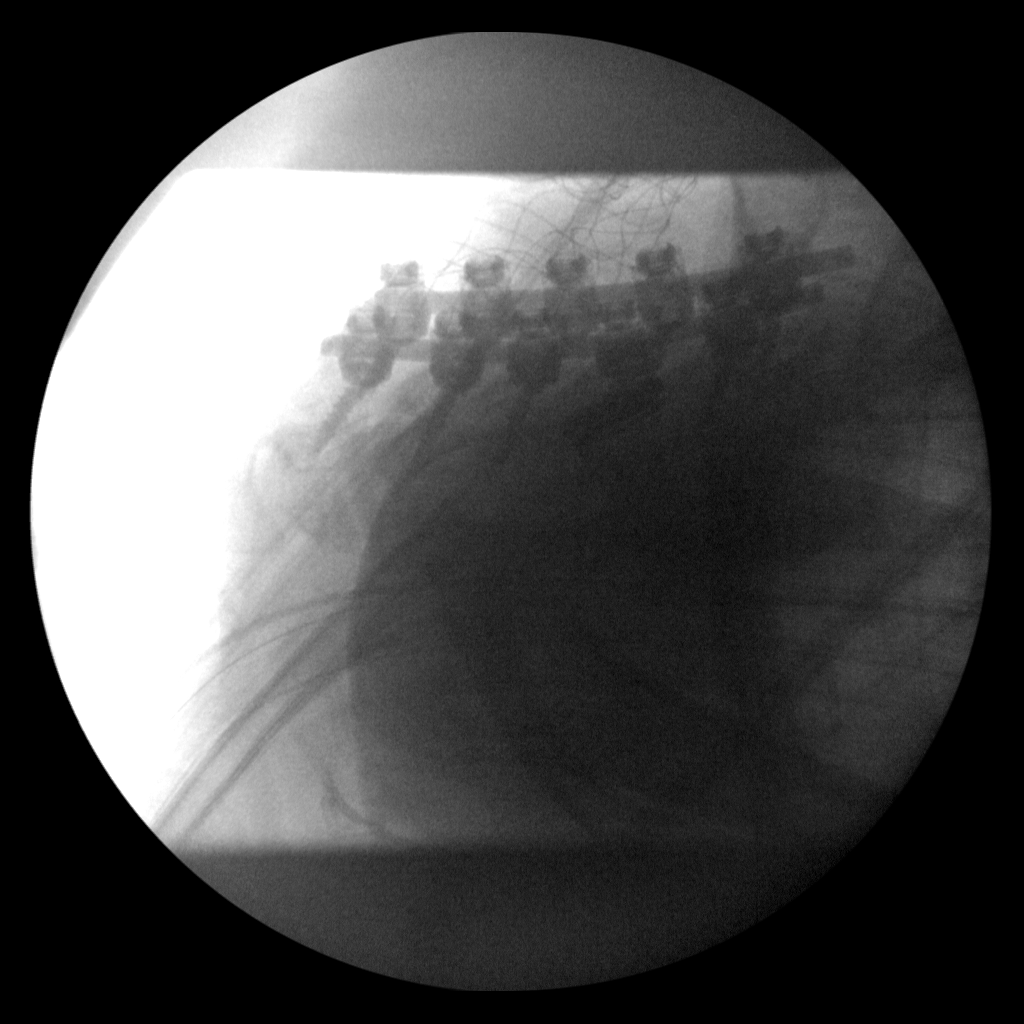

[2 of 2 positions shown; findings below may reference images not displayed]

FINDINGS: Two intraoperative lateral views of the neck were
obtained.  The bone detail is very limited on this examination.
There is evidence for an endotracheal tube and nasogastric tube.
The patient has undergone posterior spinal fusion at five levels.
Surgical fusion appears to involve C3-C7.
IMPRESSION: Posterior cervical spine fusion as described.

## 2014-06-10 ENCOUNTER — Other Ambulatory Visit: Payer: Self-pay | Admitting: Neurology

## 2014-06-15 ENCOUNTER — Other Ambulatory Visit: Payer: Self-pay | Admitting: Neurosurgery

## 2014-06-15 DIAGNOSIS — M5126 Other intervertebral disc displacement, lumbar region: Secondary | ICD-10-CM

## 2014-06-16 ENCOUNTER — Other Ambulatory Visit: Payer: Self-pay | Admitting: Neurology

## 2014-06-16 NOTE — Telephone Encounter (Signed)
I spoke with patient who says she will check her calendar and call us back to schedule appt.

## 2014-06-20 ENCOUNTER — Telehealth: Payer: Self-pay | Admitting: Neurology

## 2014-06-20 MED ORDER — DONEPEZIL HCL 10 MG PO TABS: 10.0000 mg | ORAL_TABLET | Freq: Every day | ORAL | Status: DC

## 2014-06-20 NOTE — Telephone Encounter (Signed)
Patient requesting additional pills for Rx donepezil (ARICEPT) 10 MG tablet due to appointment not scheduled until 08/01/14 @ 10:30 pm.  Please call and advise.

## 2014-06-20 NOTE — Telephone Encounter (Signed)
Rx has been sent.  I called patient back to advise.  Got no answer.  Left message.    

## 2014-06-30 ENCOUNTER — Ambulatory Visit
Admission: RE | Admit: 2014-06-30 | Discharge: 2014-06-30 | Disposition: A | Discharge status: Home / Self Care | Payer: Federal, State, Local not specified - PPO | Source: Ambulatory Visit | Attending: Neurosurgery | Admitting: Neurosurgery

## 2014-06-30 DIAGNOSIS — M5126 Other intervertebral disc displacement, lumbar region: Secondary | ICD-10-CM

## 2014-08-01 ENCOUNTER — Encounter: Payer: Self-pay | Admitting: Neurology

## 2014-08-01 ENCOUNTER — Ambulatory Visit (INDEPENDENT_AMBULATORY_CARE_PROVIDER_SITE_OTHER): Payer: BLUE CROSS/BLUE SHIELD | Admitting: Neurology

## 2014-08-01 VITALS — BP 141/81 | HR 64 | Resp 16 | Ht 59.0 in | Wt 205.0 lb

## 2014-08-01 DIAGNOSIS — M51369 Other intervertebral disc degeneration, lumbar region without mention of lumbar back pain or lower extremity pain: Secondary | ICD-10-CM

## 2014-08-01 DIAGNOSIS — M5136 Other intervertebral disc degeneration, lumbar region: Secondary | ICD-10-CM

## 2014-08-01 DIAGNOSIS — R413 Other amnesia: Secondary | ICD-10-CM

## 2014-08-01 DIAGNOSIS — G2581 Restless legs syndrome: Secondary | ICD-10-CM

## 2014-08-01 HISTORY — DX: Other intervertebral disc degeneration, lumbar region without mention of lumbar back pain or lower extremity pain: M51.369

## 2014-08-01 HISTORY — DX: Other amnesia: R41.3

## 2014-08-01 HISTORY — DX: Other intervertebral disc degeneration, lumbar region: M51.36

## 2014-08-01 MED ORDER — DONEPEZIL HCL 10 MG PO TABS: 10.0000 mg | ORAL_TABLET | Freq: Every day | ORAL | Status: AC

## 2014-08-01 MED ORDER — ROPINIROLE HCL 0.5 MG PO TABS: 0.5000 mg | ORAL_TABLET | Freq: Every day | ORAL | Status: DC

## 2014-08-01 NOTE — Addendum Note (Signed)
Addended by: Melvyn Novas on: 08/01/2014 11:22 AM   Modules accepted: Orders

## 2014-08-01 NOTE — Patient Instructions (Signed)
Restless Legs Syndrome Restless legs syndrome is a movement disorder. It may also be called a sensorimotor disorder.  CAUSES  No one knows what specifically causes restless legs syndrome, but it tends to run in families. It is also more common in people with low iron, in pregnancy, in people who need dialysis, and those with nerve damage (neuropathy).Some medications may make restless legs syndrome worse.Those medications include drugs to treat high blood pressure, some heart conditions, nausea, colds, allergies, and depression. SYMPTOMS Symptoms include uncomfortable sensations in the legs. These leg sensations are worse during periods of inactivity or rest. They are also worse while sitting or lying down. Individuals that have the disorder describe sensations in the legs that feel like:  Pulling.  Drawing.  Crawling.  Worming.  Boring.  Tingling.  Pins and needles.  Prickling.  Pain. The sensations are usually accompanied by an overwhelming urge to move the legs. Sudden muscle jerks may also occur. Movement provides temporary relief from the discomfort. In rare cases, the arms may also be affected. Symptoms may interfere with going to sleep (sleep onset insomnia). Restless legs syndrome may also be related to periodic limb movement disorder (PLMD). PLMD is another more common motor disorder. It also causes interrupted sleep. The symptoms from PLMD usually occur most often when you are awake. TREATMENT  Treatment for restless legs syndrome is symptomatic. This means that the symptoms are treated.   Massage and cold compresses may provide temporary relief.  Walk, stretch, or take a cold or hot bath.  Get regular exercise and a good night's sleep.  Avoid caffeine, alcohol, nicotine, and medications that can make it worse.  Do activities that provide mental stimulation like discussions, needlework, and video games. These may be helpful if you are not able to walk or stretch. Some  medications are effective in relieving the symptoms. However, many of these medications have side effects. Ask your caregiver about medications that may help your symptoms. Correcting iron deficiency may improve symptoms for some patients. Document Released: 07/05/2002 Document Revised: 11/29/2013 Document Reviewed: 10/11/2010 ExitCare Patient Information 2015 [employer]. This information is not intended to replace advice given to you by your health care provider. Make sure you discuss any questions you have with your health care provider.  

## 2014-08-01 NOTE — Progress Notes (Signed)
[facility]  Provider:  Dr Alp Goldwater Referring Provider: Cala Bradford, MD Primary Care Physician:  Cala Bradford, MD  Chief Complaint  Patient presents with  . RV memory    Rm 10, daughter in law    HPI:  Anna Lawson is a 64 y.o. female here as a follow up for cognitive impairment and recovery after surgeries .     08-01-14 Has still word finding  difficulties, and to her daughter's surprise was unable to recall the penicillin allergy, that affected her and her brother. She used the word rolling pin when asking for the paint roller. The family is reluctant to let her drive. She now lives with her daughter in a 3 generational household. She forgets still ingredients in cooking recipes.  Her husband golfs and is very active.     Last visit. : Anna Lawson  is was originally seen by me with a complaint of memory lapses but had affected her performance at work. 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 She had trouble multitasking and was found to be unable to operate certain machinery as or laboratory instruments but she had been working with for many decades. She also was placed some records or a misplaced the patient's name on the sample. Employer was alarmed and wanted her to be evaluated. This was all a fairly new and development at the time she presented to [place] in June 2013.Marland Kitchen She became very insecure and at times tearful she also stated that she had been a restless sleeper. The patient became also progressively short of breath at night ,   She underwent memory testing at [facility] of 30 , clock  4/4 . Her memory seems to be improving be followed on her complaint of poor sleep quality. The patient had a history of edema at the ankle bicuspid aortic stenosis high-grade Mallampati insomnia incontinence possibly some snoring but no witnessed apneas she also developed a gait imbalance and hyperreflexia attributed to cervical spinal stenosis.  On 06/09/2012 A. HST  sleep test was performed and showed no significant apnea but prolonged oxygen desaturations as well as an extremely variable heart rate. A pulse oximetry later in December 4 th  had come to  the same conclusion . Her cervical spine was worked up by an imaging study, which led to a urgent referral to a neurosurgeon. Her high-grade cervical spinal stenosis had formed the spinal cord into an hourglass shape  When she finally prepared for the neurosurgical evaluation and was already in the surgical suite, the anesthesiologist took her history and stumbled over her bicuspid stenosis.  He initiated a echocardiogram and then returned the patient was referred to Dr. Morton Peters   for a possible valve replacement. She was actually told that his valve replacement was eminent, in preparation for the cardiac surgery teeth were pulled and when she was ready to undergo the surgery Dr. Zenaida Niece Tright endoscopically evaluated her further  : Just before surgery he than stated, the surgery is not necessary for the valve replacement was not necessary and that she should instead undergo the cervical spinal stenosis ASAP. This was finally done in October 06, 2012   more than 3 months after her referral.  The patient presents today with a neck collar. Her recovery from surgery is in good progress, she saw Dr. Park Breed today ordered an MRI of the cervical spine and CT study of the shoulder the patient has right-sided shoulder pain with elevation of  the right upper extremity none of these symptoms occur on the left. She was allowed to start taking her collar off for several hours a day until she will be condition her neck musculature.  Today MOCA test is scored 27/30 points and her work fluency of 20 points.  The patient endorses high deep degree of fatigue but again her heart condition has not been addressed at she and her is the Epworth sleepiness score at 5 points only.     Review of Systems: Out of a complete 14 system review, the  patient complains of only the following symptoms, and all other reviewed systems are negative. SOB improved, back pain, lower  back. Memory loss.  Words; 16, MOCA 25-30,   History   Social History  . Marital Status: Married    Spouse Name: N/A    Number of Children: 3  . Years of Education: N/A   Occupational History  . LPN    Social History Main Topics  . Smoking status: Former Smoker -- 1.00 packs/day for 35 years    Types: Cigarettes    Quit date: 07/29/2002  . Smokeless tobacco: Never Used     Comment: occ alcohol  . Alcohol Use: Yes     Comment: occasional  . Drug Use: No  . Sexual Activity: Not on file   Other Topics Concern  . Not on file   Social History Narrative   Caffeine avg 2-3 cups daily.      Family History  Problem Relation Age of Onset  . Emphysema Maternal Grandmother     never smoked  . Emphysema Paternal Uncle     never smoked  . Heart disease Mother     AGE.Marland KitchenMarland KitchenIN HER 50'S  . Diabetes Mother     TYPE 1  . Bladder Cancer Father   . Diabetes Brother     TYPE I    Past Medical History  Diagnosis Date  . Spinal stenosis   . Hyperlipidemia   . Aortic stenosis     bicuspid AV, mid to mod AR, mod AS by 09/17/11 echo [facility])  . Shortness of breath     all time  . Ankle edema     takes HCTZ, both ankles  . Constipation   . History of blood transfusion 40 yrs ago  . Pre-operative cardiovascular examination   . Dementia     a little  . Heart murmur   . Asthma     denies  . GERD (gastroesophageal reflux disease)   . Neuromuscular disorder     muscle cramps in legs from lipid med  . Memory loss or impairment 08/01/2014  . DDD (degenerative disc disease), lumbar 08/01/2014    Past Surgical History  Procedure Laterality Date  . Cardiac catheterization  08-21-2012    03/[facility]/05 [facility]): normal coronaries, normal LV function, mild AS/AI.  No MR/MS.  Normal PA pressures and hemodynamics.  . Multiple extractions with  alveoloplasty N/A 09/15/2012    Procedure: MULTIPLE EXTRACTION WITH ALVEOLOPLASTY/PRE PROSTESTIC;  Surgeon: Charlynne Pander, DDS;  Location: [facility] ORS;  Service: Oral Surgery;  Laterality: N/A;  Extraction of tooth #9, [phone] with alveoloplasty and bilateral mandibular tori reductions  . Intraoperative transesophageal echocardiogram N/A 09/24/2012    Procedure: INTRAOPERATIVE TRANSESOPHAGEAL ECHOCARDIOGRAM;  Surgeon: Kerin Perna, MD;  [facility] OR;  Service: Open Heart Surgery;  Laterality: N/A;  . Posterior cervical fusion/foraminotomy N/A 10/06/2012    Procedure: POSTERIOR CERVICAL FUSION/FORAMINOTOMY LEVEL 4;  Surgeon: Jillyn Hidden  Alease Medina, MD;  Location: [facility] NEURO ORS;  Service: Neurosurgery;  Laterality: N/A;  cervical three-seven    Current Outpatient Prescriptions  Medication Sig Dispense Refill  . acetaminophen (TYLENOL ARTHRITIS PAIN) 650 MG CR tablet Take 1,300 mg by mouth every morning.    Marland Kitchen aspirin 81 MG tablet Take 81 mg by mouth daily after breakfast.     . DiphenhydrAMINE HCl (BENADRYL PO) Take 1 tablet by mouth every 6 (six) hours as needed. For allergies    . donepezil (ARICEPT) 10 MG tablet Take 1 tablet (10 mg total) by mouth at bedtime. 30 tablet 1  . hydrochlorothiazide (HYDRODIURIL) 25 MG tablet Take 25 mg by mouth daily after breakfast.     . ibuprofen (ADVIL,MOTRIN) 200 MG tablet Take 600 mg by mouth every 6 (six) hours as needed for pain.     Marland Kitchen omeprazole (PRILOSEC OTC) 20 MG tablet Take 20 mg by mouth daily as needed. For heartburn    . OVER THE COUNTER MEDICATION Apply 1 application topically daily as needed. For rash/allergies    . polyethylene glycol (MIRALAX / GLYCOLAX) packet Take 17 g by mouth daily as needed. For constipation    . traMADol (ULTRAM) 50 MG tablet Take 50 mg by mouth every 6 (six) hours as needed. For pain    . cyclobenzaprine (FLEXERIL) 10 MG tablet Take 1 tablet (10 mg total) by mouth 3 (three) times daily as needed for muscle  spasms. (Patient not taking: Reported on 08/01/2014) 80 tablet 0  . rOPINIRole (REQUIP) 0.5 MG tablet Take 1 tablet (0.5 mg total) by mouth at bedtime. 30 tablet 3   No current facility-administered medications for this visit.    Allergies as of 08/01/2014 - Review Complete 08/01/2014  Allergen Reaction Noted  . Adhesive [tape]  07/06/2012  . Codeine Nausea And Vomiting 07/06/2012    Vitals: BP 141/81 mmHg  Pulse 64  Resp 16  Ht  (1.499 m)  Wt 205 lb (92.987 kg)  BMI 41.38 kg/m2 Last Weight:  Wt Readings from Last 1 Encounters:  08/01/14 205 lb (92.987 kg)   Last Height:   Ht Readings from Last 1 Encounters:  08/01/14  (1.499 m)    Physical exam:  General: The patient is awake, alert and appears not in acute distress. The patient is well groomed. Head: Normocephalic, atraumatic. Neck is supple. Mallampati 2, neck circumference:16.5 inches . Cardiovascular:  Regular rate and rhythm with a aortic murmurs or carotid bruit, and without distended neck veins. Respiratory: Lungs are clear to auscultation. Skin:  Without evidence of edema, or rash Trunk: BMI is indicating obesity,  elevated and patient  has normal posture.  Neurologic exam : The patient is awake and alert, oriented to place and time.   Memory subjective  described as impaired under stress, the patient and her family have noticed that she still is anxious .There is a normal attention span & concentration ability.  Speech is fluent without dysarthria, dysphonia or aphasia.  Mood and affect are appropriate.  Cranial nerves: Pupils are equal and briskly reactive to light. Funduscopic exam without  evidence of pallor or edema. Extraocular movements  in vertical and horizontal planes intact and without nystagmus. Visual fields by finger perimetry are intact. Hearing to finger rub intact. Facial sensation intact to fine touch. Facial motor strength is symmetric and tongue and uvula move midline.  Motor exam:   Normal tone and normal muscle bulk and symmetric normal strength in all extremities. Sensory:  Fine touch, pinprick and vibration were tested in all extremities. Numbness in the right hand ringfinger. Restless legs.  Coordination: Rapid alternating movements in the fingers/hands is tested and only on hte left side normal.  Finger-to-nose maneuver tested and normal without evidence of ataxia, dysmetria or tremor. Strong grip!   Gait and station: Patient walks with cane  As  assistive device. Strength is still reduced , legs and right arm,  Stance is stable and normal. Tandem gait is fragmented.   Deep tendon reflexes: in the  upper and lower extremities are brisk.   Assessment:  After physical and neurologic examination, review of laboratory studies, imaging, neurophysiology testing and pre-existing records, assessment:    1# This patient has recovered well from  her spinal stenosis surgery , is now short of her  64th birthday. She is looking forward to medicare coverage .  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 She will have a lifelong higher risk of suffering severe cervical cord damage if she would fall.  2#  Restless legs.  Her sleep is still poor since.   #3 the patient's memory loss seems to have improved, at least stable. MOCA is today 25-30 and this is borderline for the l patients educational level. She failed the trail making and lost 3 out of 5 words.  I will continue her on Aricept. She is doing homework, but has no hobbies and lives with her daughter in the same home. She needs time to climb stair. She is all day at home and in front of the computer.    Plan:  Treatment plan and additional workup :  Start Requip and CBC diff, and CMET and Fe .  Stay on Aricept. Go to the pool. She is not driving , but she could with modifications.   Get involved with social activities. She lost her ability to bowl and to  knit. She is financially very restricted.   Check for iron deficiency anemia and prescribe Requip.

## 2014-08-02 LAB — CBC WITH DIFFERENTIAL/PLATELET
BASOS ABS: 0 10*3/uL (ref 0.0–0.2)
Basos: 0 %
EOS ABS: 0.1 10*3/uL (ref 0.0–0.4)
Eos: 1 %
HEMATOCRIT: 44.9 % (ref 34.0–46.6)
Hemoglobin: 15.3 g/dL (ref 11.1–15.9)
IMMATURE GRANULOCYTES: 0 %
Immature Grans (Abs): 0 10*3/uL (ref 0.0–0.1)
LYMPHS ABS: 1.8 10*3/uL (ref 0.7–3.1)
LYMPHS: 21 %
MCH: 30.2 pg (ref 26.6–33.0)
MCHC: 34.1 g/dL (ref 31.5–35.7)
MCV: 89 fL (ref 79–97)
MONOCYTES: 7 %
Monocytes Absolute: 0.6 10*3/uL (ref 0.1–0.9)
NEUTROS ABS: 6 10*3/uL (ref 1.4–7.0)
Neutrophils Relative %: 71 %
RBC: 5.07 x10E6/uL (ref 3.77–5.28)
RDW: 13.5 % (ref 12.3–15.4)
WBC: 8.5 10*3/uL (ref 3.4–10.8)

## 2014-08-02 LAB — COMPREHENSIVE METABOLIC PANEL
ALT: 17 IU/L (ref 0–32)
AST: 18 IU/L (ref 0–40)
Albumin/Globulin Ratio: 1.9 (ref 1.1–2.5)
Albumin: 4.6 g/dL (ref 3.6–4.8)
Alkaline Phosphatase: 88 IU/L (ref 39–117)
BUN/Creatinine Ratio: 16 (ref 11–26)
BUN: 13 mg/dL (ref 8–27)
CALCIUM: 9.5 mg/dL (ref 8.7–10.3)
CHLORIDE: 99 mmol/L (ref 97–108)
CO2: 25 mmol/L (ref 18–29)
Creatinine, Ser: 0.8 mg/dL (ref 0.57–1.00)
GFR calc Af Amer: 90 mL/min/{1.73_m2} (ref >59)
GFR calc non Af Amer: 78 mL/min/{1.73_m2} (ref >59)
GLUCOSE: 99 mg/dL (ref 65–99)
Globulin, Total: 2.4 g/dL (ref 1.5–4.5)
POTASSIUM: 4.5 mmol/L (ref 3.5–5.2)
SODIUM: 143 mmol/L (ref 134–144)
TOTAL PROTEIN: 7 g/dL (ref 6.0–8.5)
Total Bilirubin: 0.9 mg/dL (ref 0.0–1.2)

## 2014-08-11 ENCOUNTER — Encounter (HOSPITAL_COMMUNITY): Payer: Self-pay | Admitting: Neurosurgery

## 2014-12-14 ENCOUNTER — Other Ambulatory Visit: Payer: Self-pay

## 2014-12-14 DIAGNOSIS — M5136 Other intervertebral disc degeneration, lumbar region: Secondary | ICD-10-CM

## 2014-12-14 DIAGNOSIS — G2581 Restless legs syndrome: Secondary | ICD-10-CM

## 2014-12-14 DIAGNOSIS — R413 Other amnesia: Secondary | ICD-10-CM

## 2014-12-14 MED ORDER — ROPINIROLE HCL 0.5 MG PO TABS: 0.5000 mg | ORAL_TABLET | Freq: Every day | ORAL | Status: DC

## 2015-02-06 ENCOUNTER — Ambulatory Visit: Payer: BC Managed Care – PPO | Admitting: Nurse Practitioner

## 2015-03-14 ENCOUNTER — Other Ambulatory Visit: Payer: Self-pay

## 2015-03-14 ENCOUNTER — Telehealth: Payer: Self-pay | Admitting: Neurology

## 2015-03-14 DIAGNOSIS — R413 Other amnesia: Secondary | ICD-10-CM

## 2015-03-14 DIAGNOSIS — M5136 Other intervertebral disc degeneration, lumbar region: Secondary | ICD-10-CM

## 2015-03-14 DIAGNOSIS — G2581 Restless legs syndrome: Secondary | ICD-10-CM

## 2015-03-14 MED ORDER — ROPINIROLE HCL 0.5 MG PO TABS: 0.5000 mg | ORAL_TABLET | Freq: Two times a day (BID) | ORAL | Status: AC

## 2015-03-14 MED ORDER — ROPINIROLE HCL 0.5 MG PO TABS: 0.5000 mg | ORAL_TABLET | Freq: Two times a day (BID) | ORAL | Status: DC

## 2015-03-14 NOTE — Telephone Encounter (Signed)
I called back and spoke with the patient.  Says the Requip does not seem to work as well as it used to.  She does not want to change the strength, but would like to know if the directions could be updated to allow her to take an additional tablet during the day if needed.  She has to cancel her last appt due to a conflict in schedule.  Says she will be having surgery on 08/29 to replace her aortic valve, and will reschedule appt with [place] once she has recovered.  Please advise.  Thank you.

## 2015-03-14 NOTE — Telephone Encounter (Signed)
I called the patient back to advise.  She expressed understanding and appreciation.  Asked that the Rx be resent to [place] location, I have resent Rx.

## 2015-03-14 NOTE — Telephone Encounter (Signed)
Pt says that the medication rOPINIRole (REQUIP) 0.5 MG tablet is not working very well. Would like to increase the dosage if possible. Pt would need new Rx sent [facility] [employer] in [place], [place] [phone]( phone) . May call pt back at [phone]

## 2015-03-14 NOTE — Telephone Encounter (Signed)
I will be happy to allow an extra dose of a  dopaminergic agonist - refilled and gave extra tabs per month. CD
